# Patient Record
Sex: Female | Born: 1940 | Race: Black or African American | Hispanic: No | Smoking: Former smoker
Health system: Southern US, Community
[De-identification: ages and names within clinical notes are randomized; demographics above are authoritative.]

## PROBLEM LIST (undated history)

## (undated) DIAGNOSIS — I509 Heart failure, unspecified: Secondary | ICD-10-CM

## (undated) DIAGNOSIS — R0602 Shortness of breath: Secondary | ICD-10-CM

## (undated) DIAGNOSIS — F209 Schizophrenia, unspecified: Secondary | ICD-10-CM

## (undated) DIAGNOSIS — I10 Essential (primary) hypertension: Secondary | ICD-10-CM

## (undated) DIAGNOSIS — R569 Unspecified convulsions: Secondary | ICD-10-CM

## (undated) DIAGNOSIS — M79606 Pain in leg, unspecified: Secondary | ICD-10-CM

## (undated) DIAGNOSIS — R7611 Nonspecific reaction to tuberculin skin test without active tuberculosis: Secondary | ICD-10-CM

## (undated) DIAGNOSIS — I639 Cerebral infarction, unspecified: Secondary | ICD-10-CM

## (undated) DIAGNOSIS — I219 Acute myocardial infarction, unspecified: Secondary | ICD-10-CM

## (undated) HISTORY — DX: Essential (primary) hypertension: I10

## (undated) HISTORY — DX: Heart failure, unspecified: I50.9

## (undated) HISTORY — DX: Schizophrenia, unspecified: F20.9

## (undated) HISTORY — DX: Acute myocardial infarction, unspecified: I21.9

## (undated) HISTORY — DX: Shortness of breath: R06.02

## (undated) HISTORY — DX: Nonspecific reaction to tuberculin skin test without active tuberculosis: R76.11

## (undated) HISTORY — DX: Unspecified convulsions: R56.9

## (undated) HISTORY — PX: ABDOMINAL SURGERY: SHX537

## (undated) HISTORY — DX: Cerebral infarction, unspecified: I63.9

## (undated) HISTORY — DX: Pain in leg, unspecified: M79.606

---

## 2006-05-27 ENCOUNTER — Ambulatory Visit: Payer: Self-pay | Admitting: Cardiology

## 2006-07-17 ENCOUNTER — Ambulatory Visit: Payer: Self-pay | Admitting: Cardiology

## 2008-01-21 ENCOUNTER — Ambulatory Visit (HOSPITAL_COMMUNITY): Admission: RE | Admit: 2008-01-21 | Discharge: 2008-01-21 | Payer: Self-pay | Admitting: Orthopaedic Surgery

## 2008-05-16 ENCOUNTER — Emergency Department (HOSPITAL_COMMUNITY): Admission: EM | Admit: 2008-05-16 | Discharge: 2008-05-16 | Payer: Self-pay | Admitting: Emergency Medicine

## 2008-11-09 ENCOUNTER — Observation Stay (HOSPITAL_COMMUNITY): Admission: EM | Admit: 2008-11-09 | Discharge: 2008-11-11 | Payer: Self-pay | Admitting: Emergency Medicine

## 2008-11-09 ENCOUNTER — Ambulatory Visit: Payer: Self-pay | Admitting: Cardiology

## 2008-11-10 ENCOUNTER — Encounter: Payer: Self-pay | Admitting: Cardiology

## 2008-11-10 ENCOUNTER — Encounter (INDEPENDENT_AMBULATORY_CARE_PROVIDER_SITE_OTHER): Payer: Self-pay | Admitting: Pulmonary Disease

## 2008-11-11 ENCOUNTER — Encounter: Payer: Self-pay | Admitting: Cardiology

## 2009-01-07 ENCOUNTER — Encounter: Payer: Self-pay | Admitting: Cardiology

## 2009-01-07 ENCOUNTER — Emergency Department (HOSPITAL_COMMUNITY): Admission: EM | Admit: 2009-01-07 | Discharge: 2009-01-07 | Payer: Self-pay | Admitting: Emergency Medicine

## 2009-06-18 ENCOUNTER — Encounter: Payer: Self-pay | Admitting: Cardiology

## 2009-06-19 ENCOUNTER — Encounter: Payer: Self-pay | Admitting: Cardiology

## 2009-06-19 ENCOUNTER — Ambulatory Visit: Payer: Self-pay | Admitting: Cardiology

## 2009-06-24 ENCOUNTER — Encounter: Payer: Self-pay | Admitting: Cardiology

## 2009-07-18 ENCOUNTER — Encounter: Payer: Self-pay | Admitting: Physician Assistant

## 2009-07-24 ENCOUNTER — Encounter: Payer: Self-pay | Admitting: Cardiology

## 2009-08-09 ENCOUNTER — Encounter: Payer: Self-pay | Admitting: Cardiology

## 2009-09-01 ENCOUNTER — Encounter: Payer: Self-pay | Admitting: Cardiology

## 2009-09-07 DIAGNOSIS — R0609 Other forms of dyspnea: Secondary | ICD-10-CM | POA: Insufficient documentation

## 2009-09-07 DIAGNOSIS — Z8673 Personal history of transient ischemic attack (TIA), and cerebral infarction without residual deficits: Secondary | ICD-10-CM

## 2009-09-07 DIAGNOSIS — I5023 Acute on chronic systolic (congestive) heart failure: Secondary | ICD-10-CM

## 2009-09-07 DIAGNOSIS — I252 Old myocardial infarction: Secondary | ICD-10-CM

## 2009-09-07 DIAGNOSIS — I509 Heart failure, unspecified: Secondary | ICD-10-CM | POA: Insufficient documentation

## 2009-09-07 DIAGNOSIS — E875 Hyperkalemia: Secondary | ICD-10-CM | POA: Insufficient documentation

## 2009-09-07 DIAGNOSIS — I447 Left bundle-branch block, unspecified: Secondary | ICD-10-CM

## 2009-09-07 DIAGNOSIS — F17200 Nicotine dependence, unspecified, uncomplicated: Secondary | ICD-10-CM | POA: Insufficient documentation

## 2009-09-07 DIAGNOSIS — R0989 Other specified symptoms and signs involving the circulatory and respiratory systems: Secondary | ICD-10-CM

## 2009-09-07 DIAGNOSIS — I43 Cardiomyopathy in diseases classified elsewhere: Secondary | ICD-10-CM

## 2009-09-07 DIAGNOSIS — F205 Residual schizophrenia: Secondary | ICD-10-CM | POA: Insufficient documentation

## 2009-09-08 ENCOUNTER — Encounter (INDEPENDENT_AMBULATORY_CARE_PROVIDER_SITE_OTHER): Payer: Self-pay | Admitting: *Deleted

## 2009-09-08 ENCOUNTER — Ambulatory Visit: Payer: Self-pay | Admitting: Cardiology

## 2009-09-08 DIAGNOSIS — I10 Essential (primary) hypertension: Secondary | ICD-10-CM | POA: Insufficient documentation

## 2009-10-03 ENCOUNTER — Ambulatory Visit: Payer: Self-pay | Admitting: Cardiology

## 2009-10-03 ENCOUNTER — Encounter: Payer: Self-pay | Admitting: Cardiology

## 2009-10-27 ENCOUNTER — Encounter (INDEPENDENT_AMBULATORY_CARE_PROVIDER_SITE_OTHER): Payer: Self-pay | Admitting: *Deleted

## 2009-11-14 ENCOUNTER — Telehealth (INDEPENDENT_AMBULATORY_CARE_PROVIDER_SITE_OTHER): Payer: Self-pay | Admitting: *Deleted

## 2009-11-29 ENCOUNTER — Encounter: Payer: Self-pay | Admitting: Cardiology

## 2009-11-30 ENCOUNTER — Inpatient Hospital Stay (HOSPITAL_COMMUNITY): Admission: RE | Admit: 2009-11-30 | Discharge: 2009-12-06 | Payer: Self-pay | Admitting: Orthopedic Surgery

## 2009-11-30 ENCOUNTER — Ambulatory Visit: Payer: Self-pay | Admitting: Cardiology

## 2010-03-24 ENCOUNTER — Encounter: Payer: Self-pay | Admitting: Cardiology

## 2010-04-10 NOTE — Progress Notes (Signed)
Summary: [facility] calling regarding pt  Phone Note Other Incoming Call back at [phone] ext [phone]   Summary of Call: Alison Murray with DSS called regarding pt's surgical clearance. Notified Victorino Dike that we could not reach the pt. She states pt does not have a phone and her mentality is such that she would have probably thrown letter aware. Clearance will be faxed to DR. Rendall.  Initial call taken by: Cyril Loosen, RN, BSN,  November 14, 2009 4:59 PM

## 2010-04-10 NOTE — Letter (Signed)
Summary: Kari Baars - OFFICE NOTE  [facility] - OFFICE NOTE   Imported By: Claudette Laws 08/11/2009 15:36:27  _____________________________________________________________________  External Attachment:    Type:   Image     Comment:   External Document

## 2010-04-10 NOTE — Letter (Signed)
Summary: [facility] D/C DR. MARGARET CAMPBELL  [facility] D[facility]C DR. MARGARET CAMPBELL   Imported By: Zachary George 09/07/2009 18:26:52  _____________________________________________________________________  External Attachment:    Type:   Image     Comment:   External Document

## 2010-04-10 NOTE — Letter (Signed)
Summary: Discharge Summary  Discharge Summary   Imported By: Dorise Hiss 09/08/2009 08:12:28  _____________________________________________________________________  External Attachment:    Type:   Image     Comment:   External Document

## 2010-04-10 NOTE — Assessment & Plan Note (Signed)
Summary: EST-PRE-OP CLEARANCE FOR TOTAL KNEE   Visit Type:  Pre-op Evaluation Primary Provider:  Maryan Char  CC:  CHF.  History of Present Illness: The patient presents for evaluation of cardiomyopathy. She is in need of left knee replacement. She is referred for preoperative evaluation. She has been seen in the past and managed for her cardiomyopathy which is felt to be hypertensive. She's had an echocardiogram but no ischemia workup. She gets around very little because of her joint pains. She hasn't caseworker. She has schizophrenia. With a limited level of activity she denies any shortness of breath or chest discomfort. She does not have any PND or orthopnea. She has no palpitations, presyncope or syncope. She has no weight gain or edema.  Preventive Screening-Counseling & Management  Alcohol-Tobacco     Smoking Status: current     Smoking Cessation Counseling: yes     Packs/Day: 1 PPD  Current Medications (verified): 1)  Haloperidol 5 Mg Tabs (Haloperidol) .... Take 1 Tablet By Mouth Once A Day 2)  Carvedilol 12.5 Mg Tabs (Carvedilol) .... Take 1 Tablet By Mouth Two Times A Day 3)  Vitamin B-12 1000 Mcg Tabs (Cyanocobalamin) .... Take 1 Tablet By Mouth Once A Day 4)  Depakote 250 Mg Tbec (Divalproex Sodium) .... Take 1 Tablet By Mouth Three Times A Day 5)  Aspir-Trin 325 Mg Tbec (Aspirin) .... Take 1 Tablet By Mouth Once A Day 6)  Protonix 40 Mg Tbec (Pantoprazole Sodium) .... Take 1 Tablet By Mouth Once A Day 7)  Hydralazine Hcl 50 Mg Tabs (Hydralazine Hcl) .... Take 1 Tablet By Mouth Two Times A Day 8)  Vicodin 5-500 Mg Tabs (Hydrocodone-Acetaminophen) .... Take One By Mouth Every Six Hours 9)  Lisinopril 5 Mg Tabs (Lisinopril) .... Take 1 Tablet By Mouth Once A Day 10)  Amlodipine Besylate 10 Mg Tabs (Amlodipine Besylate) .... Take 1 Tablet By Mouth Once A Day 11)  Potassium Chloride Crys Cr 20 Meq Cr-Tabs (Potassium Chloride Crys Cr) .... Take 1 Tablet By Mouth Once A  Day 12)  Furosemide 40 Mg Tabs (Furosemide) .... Take 1 Tablet By Mouth Once A Day  Allergies (verified): No Known Drug Allergies  Comments:  Nurse/Medical Assistant: The patient's medication list and allergies were reviewed with the patient and were updated in the Medication and Allergy Lists.  Past History:  Past Medical History: Congestive heart failure.  Schizophrenia.  Hypertension.  Leg pain Shortness of breath CVA Hypertension MI  (vague history) Hx of seizures Positive TB Test  Past Surgical History: Abdominal surgery   Social History: Smoking Status:  current Packs/Day:  1 PPD  Review of Systems       As stated in the HPI and negative for all other systems.   Vital Signs:  Patient profile:   70 year old female Height:      63 inches Weight:      132 pounds BMI:     23.47 Pulse rate:   82 / minute BP sitting:   122 / 86  (left arm) Cuff size:   regular  Vitals Entered By: Carlye Grippe (September 08, 2009 11:03 AM) CC: CHF   Physical Exam  General:  Well developed, well nourished, in no acute distress. Head:  normocephalic and atraumatic Mouth:  Edentulous.  Oral mucosa normal. Neck:  Neck supple, no JVD. No masses, thyromegaly or abnormal cervical nodes. Chest Wall:  no deformities or breast masses noted Lungs:  Clear bilaterally to auscultation and percussion. Abdomen:  Bowel sounds positive; abdomen soft and non-tender without masses, organomegaly, or hernias noted. No hepatosplenomegaly. Msk:  diffuse muscle wasting Extremities:  diffuse severe arthritic changes with significant bony and soft tissue swelling of the left knee Neurologic:  oriented to person place and time, cranial nerves grossly intact, motor grossly intact Skin:  Intact without lesions or rashes. Cervical Nodes:  no significant adenopathy   Detailed Cardiovascular Exam  Neck    Carotids: Carotids full and equal bilaterally without bruits.      Neck Veins: Normal, no JVD.     Heart    Inspection: no deformities or lifts noted.      Palpation: PMI displaced laterally and somewhat sustained    Auscultation: regular rate and rhythm, S1, S2 without murmurs, rubs, gallops, or clicks.    Vascular    Abdominal Aorta: no palpable masses, pulsations, or audible bruits.      Femoral Pulses: normal femoral pulses bilaterally.      Pedal Pulses: normal pedal pulses bilaterally.      Radial Pulses: normal radial pulses bilaterally.      Peripheral Circulation: no clubbing, cyanosis, or edema noted with normal capillary refill.     EKG  Procedure date:  09/08/2009  Findings:      Sinus rhythm, left bundle branch block, left axis deviation  Impression & Recommendations:  Problem # 1:  CHF (ICD-428.0) She seems to be euvolemic and is on a good medical regimen. I will not change her meds. Orders: EKG w/ Interpretation (93000)  Problem # 2:  PREOPERATIVE EXAMINATION (ICD-V72.84) The patient would be at increased risk for cardiovascular complications particularly with volume overload. However, CHF alone would not the appropriate if contraindication. I do need to screen her for ischemic coronary disease with a perfusion study. If this is negative and surgery is pursued we would need to very closely follow her volume status and blood pressure throughout the course of surgery and recovery.  Problem # 3:  ESSENTIAL HYPERTENSION, BENIGN (ICD-401.1) Her blood pressure is controlled and she will continue the meds as listed.  Problem # 4:  NONDEPENDENT TOBACCO USE DISORDER (ICD-305.1) She understands the need to quit smoking.  Other Orders: Nuclear Med (Nuc Med)  Patient Instructions: 1)  Your physician has requested that you have an Lexiscan Cardiolite.  For further information please visit [email or web address]  Please follow instruction sheet, as given. 2)  Your physician wants you to follow-up in: 6 months. You will receive a reminder letter in the mail one-two  months in advance. If you don't receive a letter, please call our office to schedule the follow-up appointment.

## 2010-04-10 NOTE — Consult Note (Signed)
Summary: CARDIOLOGY CONSULT [facility]  [facility]OLOGY CONSULT [facility]   Imported By: Zachary George 09/07/2009 18:24:44  _____________________________________________________________________  External Attachment:    Type:   Image     Comment:   External Document

## 2010-04-10 NOTE — Letter (Signed)
Summary: Generic Engineer, agricultural at [facility] [address]   [place], [place] [address]   Phone: [phone]  Fax: [phone]        October 27, 2009 MRN: [id number]    [name] Johnson [address] [place], [place]  [address]    Dear Ms. PAULICK,   Please, contact our office as soon as possible regarding the results of your recent stress test. I attempted to reach you by phone at the contact number you gave [place], however, I was told that you do not live at that number.      Sincerely,    Cyril Loosen, RN, BSN  This letter has been electronically signed by your physician.

## 2010-04-10 NOTE — Letter (Signed)
Summary: Letter/ FAXED SURGERY CLEARANCE TO [facility]  Letter/ FAXED SURGERY CLEARANCE TO [facility]   Imported By: Dorise Hiss 11/29/2009 15:54:04  _____________________________________________________________________  External Attachment:    Type:   Image     Comment:   External Document

## 2010-04-10 NOTE — Letter (Signed)
Summary: Discharge Summary/ PROGRESS NOTE  Discharge Summary/ PROGRESS NOTE   Imported By: Dorise Hiss 09/08/2009 08:32:35  _____________________________________________________________________  External Attachment:    Type:   Image     Comment:   External Document

## 2010-04-10 NOTE — Letter (Signed)
Summary: Lexiscan or Dobutamine Pharmacist, community at [facility]  [address]   [place], [place] [address]   Phone: [phone]  Fax: [phone]      [facility] Cardiovascular Services  Lexiscan or Dobutamine Cardiolite Strss Test    [name] Johnson  Appointment Date:_  Appointment Time:_  Your doctor has ordered a CARDIOLITE STRESS TEST using a medication to stimulate exercise so that you will not have to walk on the treadmill to determine the condition of your heart during stress. If you take blood pressure medication, ask your doctor if you should take it the day of your test. You should not have anything to eat or drink at least 4 hours before your test is scheduled, and no caffeine, including decaffeinated tea and coffee, chocolate, and soft drinks for 24 hours before your test.  You will need to register at the Outpatient/Main Entrance at the hospital 15 minutes before your appointment time. It is a good idea to bring a copy of your order with you. They will direct you to the Diagnostic Imaging (Radiology) Department.  You will be asked to undress from the waist up and given a hospital gown to wear, so dress comfortably from the waist down for example: Sweat pants, shorts, or skirt Rubber soled lace up shoes (tennis shoes)  Plan on about three hours from registration to release from the hospital  You can take all of your medications with water the morning of your test. However, on the day of your test, you may want to hold your hydralazine, lasix and potassium until after the test is complete.

## 2010-04-13 NOTE — Letter (Signed)
Summary: Internal Other/ PATIENT HISTORY FORM  Internal Other/ PATIENT HISTORY FORM   Imported By: Dorise Hiss 09/01/2009 08:49:31  _____________________________________________________________________  External Attachment:    Type:   Image     Comment:   External Document

## 2010-04-19 ENCOUNTER — Ambulatory Visit: Payer: Medicare Other | Admitting: Cardiology

## 2010-05-24 LAB — CBC
HCT: 22.9 % — ABNORMAL LOW (ref 36.0–46.0)
HCT: 24.9 % — ABNORMAL LOW (ref 36.0–46.0)
HCT: 26 % — ABNORMAL LOW (ref 36.0–46.0)
HCT: 27.3 % — ABNORMAL LOW (ref 36.0–46.0)
HCT: 38.6 % (ref 36.0–46.0)
Hemoglobin: 13 g/dL (ref 12.0–15.0)
Hemoglobin: 7.7 g/dL — ABNORMAL LOW (ref 12.0–15.0)
Hemoglobin: 8.3 g/dL — ABNORMAL LOW (ref 12.0–15.0)
MCH: 31.1 pg (ref 26.0–34.0)
MCH: 31.2 pg (ref 26.0–34.0)
MCH: 31.3 pg (ref 26.0–34.0)
MCH: 31.3 pg (ref 26.0–34.0)
MCHC: 33.4 g/dL (ref 30.0–36.0)
MCHC: 33.6 g/dL (ref 30.0–36.0)
MCHC: 34.2 g/dL (ref 30.0–36.0)
MCV: 92.1 fL (ref 78.0–100.0)
MCV: 92.4 fL (ref 78.0–100.0)
MCV: 92.8 fL (ref 78.0–100.0)
MCV: 93.5 fL (ref 78.0–100.0)
Platelets: 211 10*3/uL (ref 150–400)
Platelets: 219 10*3/uL (ref 150–400)
Platelets: 312 10*3/uL (ref 150–400)
Platelets: 403 10*3/uL — ABNORMAL HIGH (ref 150–400)
RBC: 2.47 MIL/uL — ABNORMAL LOW (ref 3.87–5.11)
RDW: 16 % — ABNORMAL HIGH (ref 11.5–15.5)
RDW: 16.6 % — ABNORMAL HIGH (ref 11.5–15.5)
RDW: 17 % — ABNORMAL HIGH (ref 11.5–15.5)
WBC: 5.3 10*3/uL (ref 4.0–10.5)
WBC: 5.6 10*3/uL (ref 4.0–10.5)
WBC: 6.2 10*3/uL (ref 4.0–10.5)

## 2010-05-24 LAB — BASIC METABOLIC PANEL
BUN: 11 mg/dL (ref 6–23)
BUN: 8 mg/dL (ref 6–23)
CO2: 25 mEq/L (ref 19–32)
Calcium: 8.2 mg/dL — ABNORMAL LOW (ref 8.4–10.5)
Chloride: 100 mEq/L (ref 96–112)
Chloride: 101 mEq/L (ref 96–112)
Creatinine, Ser: 0.89 mg/dL (ref 0.4–1.2)
GFR calc Af Amer: >60 mL/min (ref >60)
GFR calc Af Amer: >60 mL/min (ref >60)
GFR calc non Af Amer: 58 mL/min — ABNORMAL LOW (ref >60)
Glucose, Bld: 101 mg/dL — ABNORMAL HIGH (ref 70–99)
Glucose, Bld: 124 mg/dL — ABNORMAL HIGH (ref 70–99)
Potassium: 3.5 mEq/L (ref 3.5–5.1)
Sodium: 131 mEq/L — ABNORMAL LOW (ref 135–145)

## 2010-05-24 LAB — CROSSMATCH
ABO/RH(D): B POS
ABO/RH(D): B POS
Antibody Screen: NEGATIVE

## 2010-05-24 LAB — DIFFERENTIAL
Basophils Absolute: 0.1 10*3/uL (ref 0.0–0.1)
Eosinophils Relative: 4 % (ref 0–5)
Lymphocytes Relative: 40 % (ref 12–46)
Lymphs Abs: 2.1 10*3/uL (ref 0.7–4.0)
Lymphs Abs: 5.4 10*3/uL — ABNORMAL HIGH (ref 0.7–4.0)
Monocytes Absolute: 0.2 10*3/uL (ref 0.1–1.0)
Neutro Abs: 2.6 10*3/uL (ref 1.7–7.7)
Neutro Abs: 4.8 10*3/uL (ref 1.7–7.7)

## 2010-05-24 LAB — COMPREHENSIVE METABOLIC PANEL
ALT: 17 U/L (ref 0–35)
Albumin: 4 g/dL (ref 3.5–5.2)
Alkaline Phosphatase: 73 U/L (ref 39–117)
CO2: 30 mEq/L (ref 19–32)
Calcium: 9.6 mg/dL (ref 8.4–10.5)
GFR calc Af Amer: >60 mL/min (ref >60)
GFR calc non Af Amer: 57 mL/min — ABNORMAL LOW (ref >60)
Glucose, Bld: 84 mg/dL (ref 70–99)
Potassium: 4 mEq/L (ref 3.5–5.1)
Total Bilirubin: 1 mg/dL (ref 0.3–1.2)

## 2010-05-24 LAB — SURGICAL PCR SCREEN
MRSA, PCR: NEGATIVE
Staphylococcus aureus: NEGATIVE

## 2010-05-24 LAB — URINALYSIS, ROUTINE W REFLEX MICROSCOPIC
Ketones, ur: NEGATIVE mg/dL
Nitrite: NEGATIVE
Protein, ur: NEGATIVE mg/dL

## 2010-05-24 LAB — ABO/RH: ABO/RH(D): B POS

## 2010-05-24 LAB — PROTIME-INR: Prothrombin Time: 14.1 seconds (ref 11.6–15.2)

## 2010-06-14 LAB — CBC
HCT: 33.8 % — ABNORMAL LOW (ref 36.0–46.0)
Hemoglobin: 11.1 g/dL — ABNORMAL LOW (ref 12.0–15.0)
RBC: 3.71 MIL/uL — ABNORMAL LOW (ref 3.87–5.11)
WBC: 5.4 10*3/uL (ref 4.0–10.5)

## 2010-06-14 LAB — DIFFERENTIAL
Basophils Relative: 0 % (ref 0–1)
Eosinophils Absolute: 0.1 10*3/uL (ref 0.0–0.7)
Lymphocytes Relative: 31 % (ref 12–46)
Monocytes Relative: 8 % (ref 3–12)
Neutro Abs: 3.2 10*3/uL (ref 1.7–7.7)
Neutrophils Relative %: 59 % (ref 43–77)

## 2010-06-14 LAB — BASIC METABOLIC PANEL
Calcium: 8.8 mg/dL (ref 8.4–10.5)
GFR calc Af Amer: >60 mL/min (ref >60)
GFR calc non Af Amer: 55 mL/min — ABNORMAL LOW (ref >60)
Potassium: 3.6 mEq/L (ref 3.5–5.1)
Sodium: 137 mEq/L (ref 135–145)

## 2010-06-14 LAB — POCT CARDIAC MARKERS
Myoglobin, poc: 109 ng/mL (ref 12–200)
Troponin i, poc: <0.05 ng/mL (ref 0.00–0.09)

## 2010-06-14 LAB — BRAIN NATRIURETIC PEPTIDE: Pro B Natriuretic peptide (BNP): 442 pg/mL — ABNORMAL HIGH (ref 0.0–100.0)

## 2010-06-15 LAB — DIFFERENTIAL
Basophils Absolute: 0.1 10*3/uL (ref 0.0–0.1)
Basophils Relative: 1 % (ref 0–1)
Lymphocytes Relative: 25 % (ref 12–46)
Monocytes Absolute: 0.5 10*3/uL (ref 0.1–1.0)
Neutro Abs: 4.7 10*3/uL (ref 1.7–7.7)
Neutrophils Relative %: 65 % (ref 43–77)

## 2010-06-15 LAB — URINE CULTURE: Colony Count: NO GROWTH

## 2010-06-15 LAB — CBC
HCT: 33 % — ABNORMAL LOW (ref 36.0–46.0)
Hemoglobin: 11.2 g/dL — ABNORMAL LOW (ref 12.0–15.0)
MCV: 88.8 fL (ref 78.0–100.0)
WBC: 7.3 10*3/uL (ref 4.0–10.5)

## 2010-06-15 LAB — COMPREHENSIVE METABOLIC PANEL
Alkaline Phosphatase: 56 U/L (ref 39–117)
BUN: 13 mg/dL (ref 6–23)
CO2: 28 mEq/L (ref 19–32)
Chloride: 100 mEq/L (ref 96–112)
Creatinine, Ser: 1.12 mg/dL (ref 0.4–1.2)
GFR calc non Af Amer: 49 mL/min — ABNORMAL LOW (ref >60)
Glucose, Bld: 113 mg/dL — ABNORMAL HIGH (ref 70–99)
Potassium: 3.6 mEq/L (ref 3.5–5.1)
Total Bilirubin: 0.2 mg/dL — ABNORMAL LOW (ref 0.3–1.2)

## 2010-06-15 LAB — URINALYSIS, ROUTINE W REFLEX MICROSCOPIC
Glucose, UA: NEGATIVE mg/dL
Hgb urine dipstick: NEGATIVE
Ketones, ur: NEGATIVE mg/dL
Protein, ur: NEGATIVE mg/dL
pH: 5.5 (ref 5.0–8.0)

## 2010-06-15 LAB — RAPID URINE DRUG SCREEN, HOSP PERFORMED
Amphetamines: NOT DETECTED
Barbiturates: NOT DETECTED
Cocaine: NOT DETECTED
Opiates: NOT DETECTED

## 2010-06-15 LAB — POCT CARDIAC MARKERS
CKMB, poc: 1.4 ng/mL (ref 1.0–8.0)
Myoglobin, poc: 149 ng/mL (ref 12–200)

## 2010-06-15 LAB — GLUCOSE, CAPILLARY: Glucose-Capillary: 126 mg/dL — ABNORMAL HIGH (ref 70–99)

## 2010-07-24 NOTE — Group Therapy Note (Signed)
NAMEEDIT, RICCIARDELLI            ACCOUNT NO.:  [id number]   MEDICAL RECORD NO.:  [id number]          PATIENT TYPE:  INP   LOCATION:  A212                          FACILITY:  [facility]   PHYSICIAN:  Edward L. Juanetta Gosling, M.D.DATE OF BIRTH:  02/14/1941   DATE OF PROCEDURE:  11/10/2008  DATE OF DISCHARGE:                                 PROGRESS NOTE   Ms. Glasper was admitted yesterday with change in mental status, but  this morning, she is back to baseline.  She is awake and alert.  She  still not able to give much of a history.  She says that she has some  back pain and that she was told at [facility] that she had a  broken back.  I have not seen her in my office in sometime and I am  really not sure what medications she is on whether she is in getting any  psychiatric care at all now.   Her physical exam shows that she is awake and alert.  Temperature is  97.1, pulse 66, respirations 18, blood pressure 152/91, O2 sats 98% on 2  L.  She was eaten her breakfast.  She looks comfortable.  Her chest is  clear and her heart is regular.   ASSESSMENT:  She has chronic mental illness, which I think is pretty  much unchanged.  I am not certain if she is on treatment for that or  not.  She had change in mental status, which is much improved.  She  complains of low back pain and I am not certain if she indeed does have  any sort of a problem with her back or not, and I am going to see if I  get the information from [facility].  Her blood pressures come  up.  I had held her Coreg and I will restart that today.  She has marked  cardiomegaly on chest x-ray and she is going to have an echocardiogram.      Ramon Dredge L. Juanetta Gosling, M.D.  Electronically Signed     ELH/MEDQ  D:  11/10/2008  T:  11/10/2008  Job:  [id number]

## 2010-07-24 NOTE — H&P (Signed)
NAMEGAILE, [name]            ACCOUNT NO.:  [id number]   MEDICAL RECORD NO.:  [id number]          PATIENT TYPE:  INP   LOCATION:  A212                          FACILITY:  [facility]   PHYSICIAN:  Edward L. Juanetta Gosling, M.D.DATE OF BIRTH:  1940-05-19   DATE OF ADMISSION:  11/09/2008  DATE OF DISCHARGE:  LH                              HISTORY & PHYSICAL   REASON FOR ADMISSION:  Change in mental status.   HISTORY:  [name] Johnson is a 70 year old African American female who came  with complaints of altered mental status.  She was at her home,  apparently had a call from family or friends stating that she was weak  and that she was not thinking properly.  She was brought to the  emergency room but none of her family came with her, so history is  fairly sketchy.  It is unknown exactly how long she has had this.  Her  past medical history is positive for hypertension also positive for  mental illness and in the past she has been under the care of [facility] and has had a case worker who typically accompany  her to office visits, etc.  I have not seen her in my office in about 2  years.   Her past medical history is positive for hypertension and for mental  illness, the exact type of that is unknown.   Her family history is unknown.   SOCIAL HISTORY:  She does not use any alcohol.  She smokes about a pack  of a cigarettes daily apparently.  No known illicit drug abuse.   Her review of systems is unobtainable.   She brought medications with her, which include Lasix 40 mg daily,  lisinopril 5 mg daily, carvedilol 3.125 mg b.i.d.   It is not known if she has any allergies to medications.   Physical exam showed that she is arousable, but very sleepy and  sluggish.  Her pupils do react.  Her nose and throat are clear.  Her  mucous membranes are slightly dry.  Her neck is supple without masses.  Her chest is clear without wheezes.  Her heart is regular without  murmur,  gallop, or rub.  Her abdomen is soft.  No masses felt.  Extremities showed no edema.  CNS nothing focal, but she is very  sluggish.   LABORATORY DATA:  White count 7300, hemoglobin is 11.2, platelets 298,  metabolic profile shows a glucose of 113, albumin is 3.1.  Cardiac  markers are normal, now x2.  Drug screen shows positive for  benzodiazepines, and she had not brought any benzodiazepines with her.  In the past, she has followed with [facility] for  her mental illness and she may be getting benzodiazepines from there,  but it is all this part of her history as it is very unclear.  Her EKG  shows no acute changes and her chest x-ray shows cardiomegaly, mild  pulmonary vascular congestion.  No focal infiltrates.   ASSESSMENT:  She has change in mental status but not having seen her in  sometime.  I am not sure what her normal mental status is.  She has  hypertension.  She has mental illness and she has marked cardiomegaly,  so she is  going to need to have a echocardiogram.  At this point, she is going to  be brought in for observation.  She is going to have serial neuro  checks, and she will be monitored and then try to get more information  from family or others tomorrow.      Edward L. Juanetta Gosling, M.D.  Electronically Signed     ELH/MEDQ  D:  11/09/2008  T:  11/10/2008  Job:  [id number]

## 2010-07-28 ENCOUNTER — Inpatient Hospital Stay (HOSPITAL_COMMUNITY): Payer: Medicare Other

## 2010-07-28 ENCOUNTER — Inpatient Hospital Stay (HOSPITAL_COMMUNITY)
Admission: EM | Admit: 2010-07-28 | Discharge: 2010-08-03 | DRG: 292 | Disposition: A | Discharge status: Home Health | Payer: Medicare Other | Source: Other Acute Inpatient Hospital | Attending: Internal Medicine | Admitting: Internal Medicine

## 2010-07-28 DIAGNOSIS — G40909 Epilepsy, unspecified, not intractable, without status epilepticus: Secondary | ICD-10-CM | POA: Diagnosis present

## 2010-07-28 DIAGNOSIS — Z91199 Patient's noncompliance with other medical treatment and regimen due to unspecified reason: Secondary | ICD-10-CM

## 2010-07-28 DIAGNOSIS — J4489 Other specified chronic obstructive pulmonary disease: Secondary | ICD-10-CM | POA: Diagnosis present

## 2010-07-28 DIAGNOSIS — M549 Dorsalgia, unspecified: Secondary | ICD-10-CM | POA: Diagnosis present

## 2010-07-28 DIAGNOSIS — J449 Chronic obstructive pulmonary disease, unspecified: Secondary | ICD-10-CM | POA: Diagnosis present

## 2010-07-28 DIAGNOSIS — E039 Hypothyroidism, unspecified: Secondary | ICD-10-CM | POA: Diagnosis present

## 2010-07-28 DIAGNOSIS — E785 Hyperlipidemia, unspecified: Secondary | ICD-10-CM | POA: Diagnosis present

## 2010-07-28 DIAGNOSIS — Z8673 Personal history of transient ischemic attack (TIA), and cerebral infarction without residual deficits: Secondary | ICD-10-CM

## 2010-07-28 DIAGNOSIS — I2510 Atherosclerotic heart disease of native coronary artery without angina pectoris: Secondary | ICD-10-CM | POA: Diagnosis present

## 2010-07-28 DIAGNOSIS — I2589 Other forms of chronic ischemic heart disease: Secondary | ICD-10-CM | POA: Diagnosis present

## 2010-07-28 DIAGNOSIS — Z9119 Patient's noncompliance with other medical treatment and regimen: Secondary | ICD-10-CM

## 2010-07-28 DIAGNOSIS — F200 Paranoid schizophrenia: Secondary | ICD-10-CM | POA: Diagnosis present

## 2010-07-28 DIAGNOSIS — I10 Essential (primary) hypertension: Secondary | ICD-10-CM | POA: Diagnosis present

## 2010-07-28 DIAGNOSIS — Z96659 Presence of unspecified artificial knee joint: Secondary | ICD-10-CM

## 2010-07-28 DIAGNOSIS — I5023 Acute on chronic systolic (congestive) heart failure: Principal | ICD-10-CM | POA: Diagnosis present

## 2010-07-28 DIAGNOSIS — I509 Heart failure, unspecified: Secondary | ICD-10-CM | POA: Diagnosis present

## 2010-07-28 DIAGNOSIS — Z7982 Long term (current) use of aspirin: Secondary | ICD-10-CM

## 2010-07-28 LAB — LIPID PANEL
Cholesterol: 149 mg/dL (ref 0–200)
Total CHOL/HDL Ratio: 2.4 RATIO
VLDL: 10 mg/dL (ref 0–40)

## 2010-07-28 LAB — DIFFERENTIAL
Basophils Absolute: 0 10*3/uL (ref 0.0–0.1)
Lymphocytes Relative: 20 % (ref 12–46)
Lymphs Abs: 1.2 10*3/uL (ref 0.7–4.0)
Monocytes Absolute: 0.1 10*3/uL (ref 0.1–1.0)
Neutro Abs: 4.8 10*3/uL (ref 1.7–7.7)

## 2010-07-28 LAB — COMPREHENSIVE METABOLIC PANEL
ALT: 14 U/L (ref 0–35)
AST: 19 U/L (ref 0–37)
Alkaline Phosphatase: 81 U/L (ref 39–117)
CO2: 27 mEq/L (ref 19–32)
Calcium: 8.8 mg/dL (ref 8.4–10.5)
Chloride: 102 mEq/L (ref 96–112)
GFR calc non Af Amer: >60 mL/min (ref >60)
Glucose, Bld: 125 mg/dL — ABNORMAL HIGH (ref 70–99)
Sodium: 139 mEq/L (ref 135–145)
Total Bilirubin: 0.1 mg/dL — ABNORMAL LOW (ref 0.3–1.2)

## 2010-07-28 LAB — CBC
Hemoglobin: 13.7 g/dL (ref 12.0–15.0)
MCH: 28.7 pg (ref 26.0–34.0)
MCHC: 33.7 g/dL (ref 30.0–36.0)
MCV: 84.9 fL (ref 78.0–100.0)
RBC: 4.78 MIL/uL (ref 3.87–5.11)
RDW: 15.4 % (ref 11.5–15.5)
WBC: 6.2 10*3/uL (ref 4.0–10.5)

## 2010-07-28 LAB — PRO B NATRIURETIC PEPTIDE: Pro B Natriuretic peptide (BNP): 12162 pg/mL — ABNORMAL HIGH (ref 0–125)

## 2010-07-28 LAB — CARDIAC PANEL(CRET KIN+CKTOT+MB+TROPI)
Total CK: 251 U/L — ABNORMAL HIGH (ref 7–177)
Total CK: 289 U/L — ABNORMAL HIGH (ref 7–177)
Troponin I: <0.3 ng/mL (ref <0.30)

## 2010-07-28 LAB — MAGNESIUM: Magnesium: 1.9 mg/dL (ref 1.5–2.5)

## 2010-07-28 LAB — TSH: TSH: 0.579 u[IU]/mL (ref 0.350–4.500)

## 2010-07-30 DIAGNOSIS — I501 Left ventricular failure: Secondary | ICD-10-CM

## 2010-07-30 LAB — BASIC METABOLIC PANEL
CO2: 28 mEq/L (ref 19–32)
Calcium: 8.9 mg/dL (ref 8.4–10.5)
Chloride: 96 mEq/L (ref 96–112)
Creatinine, Ser: 0.75 mg/dL (ref 0.4–1.2)
Glucose, Bld: 84 mg/dL (ref 70–99)

## 2010-07-30 LAB — PHOSPHORUS: Phosphorus: 4.1 mg/dL (ref 2.3–4.6)

## 2010-07-31 DIAGNOSIS — F200 Paranoid schizophrenia: Secondary | ICD-10-CM

## 2010-07-31 LAB — MAGNESIUM: Magnesium: 2.1 mg/dL (ref 1.5–2.5)

## 2010-07-31 LAB — BASIC METABOLIC PANEL
BUN: 17 mg/dL (ref 6–23)
CO2: 27 mEq/L (ref 19–32)
Chloride: 93 mEq/L — ABNORMAL LOW (ref 96–112)
Creatinine, Ser: 0.87 mg/dL (ref 0.4–1.2)
Potassium: 4.1 mEq/L (ref 3.5–5.1)

## 2010-07-31 LAB — PHOSPHORUS: Phosphorus: 3.8 mg/dL (ref 2.3–4.6)

## 2010-07-31 NOTE — Consult Note (Signed)
  NAME:  [name] Johnson, [name] Johnson            ACCOUNT NO.:  [id number]  MEDICAL RECORD NO.:  [id number]           PATIENT TYPE:  I  LOCATION:  4741                         FACILITY:  [facility]  PHYSICIAN:  Eulogio Ditch, MD DATE OF BIRTH:  06/21/1940  DATE OF CONSULTATION: DATE OF DISCHARGE:                                CONSULTATION   REASON FOR CONSULT:  Agitation and medication management.  HISTORY OF PRESENT ILLNESS:  A 70 year old female who was transferred from [facility] secondary to diagnosis of CHF exacerbation.  The patient has a history of schizophrenia.  She is on fluphenazine 25 mg IM along with Depakote 250 mg three times a day and Haldol 5 mg at bedtime.  When I saw the patient, the patient was labile, irritable, unable to provide any logical information.  As per the nursing staff, the patient was agitated on and off and was also paranoid.  The patient denies hearing any voices.  Denies any suicidal or homicidal ideations.The patient reported that she has a case manager named Victorino Dike and we can call her to get more information.  SUBSTANCE ABUSE HISTORY:  None reported.  MENTAL STATUS EXAMINATION:  The patient is irritable, labile during the interview.  No abnormal movements noticed.  No symptoms or signs of tardive dyskinesia present.  Mood anxious, irritable during the interview.  Affect mood congruent.  Thought process.  The patient is not logical and at certain times was difficult to redirect.  Denies hearing any voices.  Does not seem to be internally preoccupied, alert, awake, oriented to place and person, but not to time.  Memory immediate, recent remote poor.  Abstraction ability poor.  Insight and judgment poor.  DIAGNOSES:  AXIS I:  As per history schizophrenia, paranoid type. AXIS II:  Deferred. AXIS III:  See medical notes. AXIS IV:  Chronic mental and medical issues. AXIS V:  40 to 50.  RECOMMENDATIONS: 1. I added Klonopin 0.5 mg three  times a day for agitation. 2. I also spoke with Dr. Delsa Grana and told him to speak with the case     manager, Victorino Dike to get more information on this patient regarding     the psych history and psych medications. 3. Sitter can be continued at this time for agitation and     inappropriate behavior. 4. I will follow up on this patient as needed.  Thanks for involving me in taking care of this patient.     Eulogio Ditch, MD     SA/MEDQ  D:  07/31/2010  T:  07/31/2010  Job:  [id number]  Electronically Signed by Eulogio Ditch  on 07/31/2010 07:34:09 PM

## 2010-08-02 DIAGNOSIS — F200 Paranoid schizophrenia: Secondary | ICD-10-CM

## 2010-08-09 NOTE — Discharge Summary (Signed)
  [name], Johnson            ACCOUNT NO.:  [id number]  MEDICAL RECORD NO.:  [id number]           PATIENT TYPE:  I  LOCATION:  4741                         FACILITY:  [facility]  PHYSICIAN:  Peggye Pitt, M.D. DATE OF BIRTH:  [birth year]  DATE OF ADMISSION:  07/28/2010 DATE OF DISCHARGE:  08/03/2010                              DISCHARGE SUMMARY   ADDENDUM  The patient's discharge was delayed on the basis of her schizophrenia. We have been waiting Psychiatry to assess her for decision making capacity.  Dr. Rogers Blocker with Psychiatry has seen her and believes that she lacks decision making capacity given her schizophrenia.  PT had initially recommended SNF, so she had stayed trying to find a SNF bed. However, after multiple discussions between Social Work and the patient's out-of-facility Child psychotherapist and Sports coach, we believe that the patient has adequate support and followup at home in order to go home.  It appears that she has been thriving in her home setting, and on the contrary, has become very agitated and combative when she has been admitted to a facility as has happened in the past.  At this point, we will make a decision to send her home.  She will need to follow up with her PCP, Dr. Kari Baars within 2-4 weeks.  Please note that prior discharge diagnoses and medications have not changed since dictated by Dr. Delsa Grana on Jul 31, 2010.     Peggye Pitt, M.D.     EH/MEDQ  D:  08/03/2010  T:  08/04/2010  Job:  [id number]  Electronically Signed by Peggye Pitt M.D. on 08/09/2010 07:43:08 AM

## 2010-08-09 NOTE — H&P (Signed)
NAMETERRILEE, [name] Johnson            ACCOUNT NO.:  [id number]  MEDICAL RECORD NO.:  [id number]           PATIENT TYPE:  I  LOCATION:  4741                         FACILITY:  [facility]  PHYSICIAN:  Lonia Blood, M.D.      DATE OF BIRTH:  [birth year]  DATE OF ADMISSION:  07/28/2010 DATE OF DISCHARGE:                             HISTORY & PHYSICAL   PRIMARY CARE PHYSICIAN:  [name] L. Juanetta Gosling, MD in [place].  PRESENTING COMPLAINT:  Shortness of breath.  HISTORY OF PRESENT ILLNESS:  The patient is a 70 year old female who transferred from [facility] secondary to diagnosis of CHF exacerbation.  She was seen in the ED there with complaint of sudden onset of shortness of breath at home.  She had known history of COPD and CHF.  EF is unknown to [place] at this point.  She apparently was seen by EMS.  She was brought in there, no chest pain but mainly shortness of breath with some cough.  Her evaluation including chest x-ray showed pulmonary edema.  She was to be admitted for inpatient care there, however, they have no tele bed, so the patient was transferred to [place] for further management.  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  ALLERGIES:  NONSTEROIDAL ANTI-INFLAMMATORY AGENTS.  SOCIAL HISTORY:  The patient lives in the ED, [place].  She smokes about one pack per day.  Denied alcohol or IV drug use.  She is single and resides currently at home.  FAMILY HISTORY:  Denied any significant family history.  CURRENT MEDICATIONS:  Home medicines include: 1. Lasix 40 mg daily. 2. Fluphenazine 25 mg IM as of 2 weeks. 3. Lisinopril 20 mg daily. 4. Coreg 3.125 mg b.i.d. 5. Potassium  chloride 20 mEq daily. 6. Haldol 5 mg every night. 7. Depakote 250 mg p.o. t.i.d.  PHYSICAL EXAMINATION:  VITAL SIGNS:  She is afebrile with temperature of 98, blood pressure 120/64, pulse 82, respiratory rate 16, sats 93% on room air.  GENERAL:  She is awake, alert, oriented, chronically ill- looking patient, small frame.  She is in no acute distress. HEENT:  PERRL.  EOMI.  No pallor, no jaundice.  No rhinorrhea. NECK:  Supple.  No visible JVD.  No lymphadenopathy. RESPIRATORY:  She has poor decreased air entry at the bases with some mild crackles.  No wheezes heard. EXTREMITIES:  No edema, cyanosis or clubbing. SKIN:  No rashes or ulcers.  LABORATORY DATA:  Her labs here are currently pending, but she did have labs from [facility] that are consistent with white count 7.1, hemoglobin of 13.0 with platelet count 255, relatively normal differentials.  Her pH is 7.36, pCO2 37, pO2 of 56 with oxygen sat of 92.6.  Her chest x-ray also showed pulmonary edema.  ASSESSMENT:  This is a 70 year old female presenting with sudden onset of shortness of breath and seems to have congestive heart failure exacerbation.  PLAN: 1. Acute CHF  exacerbation.  We will admit the patient to tele bed.  IV     Lasix.  Check her a echo.  Obtain her previous records.  Workup for     her CHF.  We will see if she has any other cardiologist that we can     refer her back to.  Her EKG shows normal sinus rhythm with evidence     of LVH, IVCD, LAD and prolonged QT.  We do not have an old one to     compare at this point. 2. Schizophrenia.  We will continue with her home medications. 3. Hypertension.  Again, I will continue with her home medicines as     much as possible. 4. Hyperlipidemia.  We will check fasting lipid panel and continue her     current medications. 5. Chronic back pain.  Continue with pain control as per home     medicine.  Other problem seems stable and we will treat them as     they come  along.     Lonia Blood, M.D.     Verlin Grills  D:  07/28/2010  T:  07/28/2010  Job:  [id number]  Electronically Signed by Lonia Blood M.D. on 08/09/2010 11:52:53 AM

## 2010-08-21 NOTE — Discharge Summary (Signed)
[name] Johnson, FOGARTY            ACCOUNT NO.:  [id number]  MEDICAL RECORD NO.:  [id number]           PATIENT TYPE:  I  LOCATION:  4741                         FACILITY:  [facility]  PHYSICIAN:  Rock Nephew, MD       DATE OF BIRTH:  May 18, 1940  DATE OF ADMISSION:  07/28/2010 DATE OF DISCHARGE:                        DISCHARGE SUMMARY - REFERRING   PRIMARY CARE PHYSICIAN:  Edward L. Juanetta Gosling, M.D.  PRIMARY CARDIOLOGIST:  Rollene Rotunda, MD, [facility]  DISCHARGE DIAGNOSES: 1. Acute systolic congestive heart failure and chronic obstructive     pulmonary disease not on home oxygen. 2. Schizophrenia with agitation, awaiting capacity evaluation and     medical management. 3. Hypothyroidism.  TSH within normal limits. 4. Chronic back pain. 5. Noncompliance. 6. Unsteadiness ataxia needing SNF. 7. History of hyperlipidemia. 8. History of previous cerebrovascular accident. 9. History of hypertension. 10.Coronary artery disease.  DISCHARGE MEDICATIONS:  Will be dictated at the time of discharge, however the patient should be on following medications, most likely 1. Acetaminophen. 2. Lisinopril 20 mg p.o. daily. 3. Depakote 250 mg p.o. t.i.d. 4. Fluphenazine injection IM 25 mg every 2 weeks. 5. Haldol 1 mg p.o. daily. 6. Carvedilol 3.125 mg p.o. b.i.d. 7. Furosemide 40 mg p.o. daily. 8. Potassium chloride 20 mEq p.o. daily. 9. Aspirin 81 mg p.o. daily. 10.Lorazepam 1 mg p.o. q.6 h p.r.n. anxiety, agitation. 11.Atrovent 0.5 mg neb inhaled b.i.d.  DISPOSITION:  Still pending.  Physical therapy, occupational therapy have recommended skilled nursing facility and also we are waiting psych consult for a psychiatric evaluation of the patient and if the patient needs inpatient psychiatric.  The patient is currently with a sitter.  DIET:  Heart-healthy with a 1.5 liters fluid restriction.  CONSULTATIONS:  Psychiatry.  PROCEDURES PERFORMED:  The patient had two-view chest x-ray on Jul 28, 2010 showed cardiomegaly without edema low lung volumes.  The patient had a 2-D echocardiogram which showed left ventricle ejection fraction of 30%-35% which is improved from previously.  Again disposition was SNF versus inpatient psych.  BRIEF HISTORY OF PRESENT ILLNESS:  Chief complaint was shortness breath.  This is a 70 year old female who was transferred from [facility] secondary diagnosis of CHF exacerbation.  The reason for the transfer was [facility] did not have any telemetry beds.  HOSPITAL COURSE: 1. Acute systolic congestive heart failure.  The patient came to the     hospital.  The patient did not look visibly short of breath.     However the patient had a pro BNP of 12,162.  She was diuresed by     IV Lasix.  The patient was continued on other cardiac medications.     The patient was later switched over p.o. Lasix her home dose.  I     suspect the reason for this CHF exacerbation was noncompliance.  EF     showed a ejection fraction to 30%-35% which is improvement. 2. Chronic obstructive pulmonary disease.  The patient's COPD is     stable.  The patient is getting schedule Atrovent.  She is not on     home oxygen. 3. Schizophrenia.  The patient was agitated at times, she is still     requiring a Comptroller.  We consulted psychiatric on Sunday for a     capacity evaluation as well as medical management and also the     question whether or not the patient needs inpatient psych     stabilization.  Currently, we are controlling the patient with     Ativan. 4. Hypothyroidism.  The patient had TSH level drawn which was within     normal limits.  The patient does not seem to take levothyroxine at     home. 5. Chronic back pain.  The patient has a history of chronic back pain,     which has been stable. 6. Noncompliance.  The patient was counseled. 7. Unsteadiness and ataxia.  The patient was seen by physical therapy,     occupational therapy they  recommended skilled nursing home     placement. 8. DVT prophylaxis.  The patient is on Lovenox for DVT prophylaxis.     Rock Nephew, MD     NH/MEDQ  D:  07/31/2010  T:  07/31/2010  Job:  [id number]  cc:   Ramon Dredge L. Juanetta Gosling, M.D. Rollene Rotunda, MD, [facility] Eulogio Ditch, MD  Electronically Signed by Rock Nephew MD on 08/21/2010 12:30:29 PM

## 2010-09-05 NOTE — Discharge Summary (Signed)
  NAME:  [name] Johnson, [name] Johnson NO.:  [id number]  MEDICAL RECORD NO.:  [id number]  LOCATION:  4741                         FACILITY:  [facility]  PHYSICIAN:  Peggye Pitt, M.D. DATE OF BIRTH:  1940/05/11  DATE OF ADMISSION:  07/28/2010 DATE OF DISCHARGE:  08/03/2010                              DISCHARGE SUMMARY   ADDENDUM: Please note that this is an addendum to prior discharge summary simply to state that on the discharge medications, she was also discharged on clonazepam 0.5 mg 1 tablet by mouth 3 times a day in addition to other discharge medications and prior discharge summary.     Peggye Pitt, M.D.     EH/MEDQ  D:  08/30/2010  T:  08/30/2010  Job:  [id number]  Electronically Signed by Peggye Pitt M.D. on 09/05/2010 07:38:26 PM

## 2010-09-13 ENCOUNTER — Encounter: Payer: Self-pay | Admitting: Cardiology

## 2010-11-28 ENCOUNTER — Emergency Department (HOSPITAL_COMMUNITY)
Admission: EM | Admit: 2010-11-28 | Discharge: 2010-11-28 | Disposition: A | Discharge status: Home / Self Care | Payer: Medicare Other | Attending: Emergency Medicine | Admitting: Emergency Medicine

## 2010-11-28 ENCOUNTER — Other Ambulatory Visit: Payer: Self-pay

## 2010-11-28 ENCOUNTER — Encounter (HOSPITAL_COMMUNITY): Payer: Self-pay | Admitting: Emergency Medicine

## 2010-11-28 ENCOUNTER — Emergency Department (HOSPITAL_COMMUNITY): Payer: Medicare Other

## 2010-11-28 DIAGNOSIS — R569 Unspecified convulsions: Secondary | ICD-10-CM | POA: Insufficient documentation

## 2010-11-28 DIAGNOSIS — I252 Old myocardial infarction: Secondary | ICD-10-CM | POA: Insufficient documentation

## 2010-11-28 DIAGNOSIS — F209 Schizophrenia, unspecified: Secondary | ICD-10-CM | POA: Insufficient documentation

## 2010-11-28 DIAGNOSIS — I447 Left bundle-branch block, unspecified: Secondary | ICD-10-CM | POA: Insufficient documentation

## 2010-11-28 DIAGNOSIS — Z8673 Personal history of transient ischemic attack (TIA), and cerebral infarction without residual deficits: Secondary | ICD-10-CM | POA: Insufficient documentation

## 2010-11-28 DIAGNOSIS — F17200 Nicotine dependence, unspecified, uncomplicated: Secondary | ICD-10-CM | POA: Insufficient documentation

## 2010-11-28 DIAGNOSIS — R079 Chest pain, unspecified: Secondary | ICD-10-CM | POA: Insufficient documentation

## 2010-11-28 DIAGNOSIS — I10 Essential (primary) hypertension: Secondary | ICD-10-CM | POA: Insufficient documentation

## 2010-11-28 DIAGNOSIS — I509 Heart failure, unspecified: Secondary | ICD-10-CM | POA: Insufficient documentation

## 2010-11-28 LAB — CARDIAC PANEL(CRET KIN+CKTOT+MB+TROPI)
CK, MB: 4.8 ng/mL — ABNORMAL HIGH (ref 0.3–4.0)
Relative Index: 3.1 — ABNORMAL HIGH (ref 0.0–2.5)
Troponin I: <0.3 ng/mL (ref <0.30)

## 2010-11-28 LAB — BASIC METABOLIC PANEL
BUN: 16 mg/dL (ref 6–23)
Calcium: 9.9 mg/dL (ref 8.4–10.5)
Creatinine, Ser: 1.3 mg/dL — ABNORMAL HIGH (ref 0.50–1.10)
GFR calc non Af Amer: 41 mL/min — ABNORMAL LOW (ref >60)

## 2010-11-28 LAB — CBC
HCT: 40 % (ref 36.0–46.0)
MCH: 29.4 pg (ref 26.0–34.0)
MCHC: 34.3 g/dL (ref 30.0–36.0)
MCV: 85.8 fL (ref 78.0–100.0)
Platelets: 247 10*3/uL (ref 150–400)
RDW: 15.8 % — ABNORMAL HIGH (ref 11.5–15.5)
WBC: 5.6 10*3/uL (ref 4.0–10.5)

## 2010-11-28 LAB — POCT I-STAT TROPONIN I: Troponin i, poc: 0 ng/mL (ref 0.00–0.08)

## 2010-11-28 LAB — PRO B NATRIURETIC PEPTIDE: Pro B Natriuretic peptide (BNP): 14564 pg/mL — ABNORMAL HIGH (ref 0–125)

## 2010-11-28 MED ORDER — HYDROCODONE-ACETAMINOPHEN 5-325 MG PO TABS
1.0000 | ORAL_TABLET | Freq: Once | ORAL | Status: AC
  Administered 2010-11-28: 1 via ORAL
  Filled 2010-11-28: qty 1

## 2010-11-28 MED ORDER — HYDROCODONE-ACETAMINOPHEN 5-325 MG PO TABS: 1.0000 | ORAL_TABLET | ORAL | Status: AC | PRN

## 2010-11-28 NOTE — ED Notes (Signed)
Pt alert/oriented. Pt c/o center of chest hurting since yesterday that is sharp.  Had aspirin 324mg  en route.  cbg 154 e route. Pt nondiaphoretic.  Pt states she gets sob and nausea with the pain. Denies dizziness. Coughed up yellow phlegm on way in. Hx of MI. States pain to chest is " a lot " at this time.

## 2010-11-28 NOTE — ED Notes (Signed)
2 numbers called and no answer. Left message that pt is ready for d/c

## 2010-11-28 NOTE — ED Provider Notes (Signed)
History     CSN: [id number] Arrival date & time: 11/28/2010 12:41 PM   Chief Complaint  Patient presents with  . Chest Pain     (Include location/radiation/quality/duration/timing/severity/associated sxs/prior treatment) The history is provided by the patient and the EMS personnel. The history is limited by the condition of the patient.  PATIENT STATES CHEST PAIN ON OFF AND ABD PAIN ON OFF FOR ONE WEEK BUT ATLEAST SINCE YESTERDAY. IN ED IN NAD. NO DIAPHORESIS, NO VOMITING BUT STATES NAUSEATED IMPROVED WITH PAIN MEDS, EMS GAVE ASA ENROUTE.    Past Medical History  Diagnosis Date  . CHF (congestive heart failure)   . Hypertension   . Schizophrenia   . Leg pain   . SOB (shortness of breath)   . Stroke   . Myocardial infarction     Vague history  . Seizures   . Positive TB test      Past Surgical History  Procedure Date  . Abdominal surgery     Family History  Problem Relation Age of Onset  . Heart disease Other   . Hypertension Other     History  Substance Use Topics  . Smoking status: Current Everyday Smoker -- 1.0 packs/day  . Smokeless tobacco: Not on file  . Alcohol Use: No    OB History    Grav Para Term Preterm Abortions TAB SAB Ect Mult Living                  Review of Systems  Unable to perform ROS   Allergies  Review of patient's allergies indicates not on file.  Home Medications   Current Outpatient Rx  Name Route Sig Dispense Refill  . AMLODIPINE BESYLATE 10 MG PO TABS Oral Take 10 mg by mouth daily.      . ASPIRIN EC 325 MG PO TBEC Oral Take 325 mg by mouth daily.      Marland Kitchen CARVEDILOL 12.5 MG PO TABS Oral Take 12.5 mg by mouth 2 (two) times daily.      Marland Kitchen DIVALPROEX SODIUM 250 MG PO TBEC Oral Take 250 mg by mouth 3 (three) times daily.      . FUROSEMIDE 40 MG PO TABS Oral Take 40 mg by mouth daily.      Marland Kitchen HALOPERIDOL 5 MG PO TABS Oral Take 5 mg by mouth daily.      Marland Kitchen HYDRALAZINE HCL 50 MG PO TABS Oral Take 50 mg by mouth 2 (two) times  daily.      Marland Kitchen HYDROCODONE-ACETAMINOPHEN 5-500 MG PO TABS Oral Take 1 tablet by mouth every 6 (six) hours.      Marland Kitchen LISINOPRIL 5 MG PO TABS Oral Take 5 mg by mouth daily.      Marland Kitchen PANTOPRAZOLE SODIUM 40 MG PO TBEC Oral Take 40 mg by mouth daily.      Marland Kitchen POTASSIUM CHLORIDE CRYS CR 20 MEQ PO TBCR Oral Take 20 mEq by mouth daily.      Marland Kitchen VITAMIN B-12 1000 MCG PO TABS Oral Take 1,000 mcg by mouth daily.        Physical Exam    BP 150/107  Pulse 87  Temp(Src) 97.9 F (36.6 C) (Oral)  Resp 19  SpO2 100%  Physical Exam  Nursing note and vitals reviewed. Constitutional: She appears well-developed and well-nourished. No distress.  Eyes: EOM are normal. Pupils are equal, round, and reactive to light.  Neck: Normal range of motion. Neck supple.  Cardiovascular: Normal rate, regular rhythm and normal heart  sounds.   Pulmonary/Chest: Effort normal and breath sounds normal. She has no wheezes. She has no rales. She exhibits no tenderness.  Abdominal: Soft. Bowel sounds are normal. There is no tenderness.  Musculoskeletal: Normal range of motion. She exhibits no edema.  Neurological: She is alert. No cranial nerve deficit. She exhibits normal muscle tone.  Skin: No rash noted.    ED Course  Procedures  Results for orders placed during the hospital encounter of 07/28/10  DIFFERENTIAL      Component Value Range   Neutrophils Relative 78 (*) 43 - 77 (%)   Neutro Abs 4.8  1.7 - 7.7 (K/uL)   Lymphocytes Relative 20  12 - 46 (%)   Lymphs Abs 1.2  0.7 - 4.0 (K/uL)   Monocytes Relative 2 (*) 3 - 12 (%)   Monocytes Absolute 0.1  0.1 - 1.0 (K/uL)   Eosinophils Relative 0  0 - 5 (%)   Eosinophils Absolute 0.0  0.0 - 0.7 (K/uL)   Basophils Relative 0  0 - 1 (%)   Basophils Absolute 0.0  0.0 - 0.1 (K/uL)  CBC      Component Value Range   WBC 6.2  4.0 - 10.5 (K/uL)   RBC 4.78  3.87 - 5.11 (MIL/uL)   Hemoglobin 13.7  12.0 - 15.0 (g/dL)   HCT 16.1  09.6 - 04.5 (%)   MCV 84.9  78.0 - 100.0 (fL)   MCH  28.7  26.0 - 34.0 (pg)   MCHC 33.7  30.0 - 36.0 (g/dL)   RDW 40.9  81.1 - 91.4 (%)   Platelets 261  150 - 400 (K/uL)  CARDIAC PANEL(CRET KIN+CKTOT+MB+TROPI)      Component Value Range   Total CK 251 (*) 7 - 177 (U/L)   CK, MB 5.7 (*) 0.3 - 4.0 (ng/mL)   Troponin I    <0.30 (ng/mL)   Value: <0.30            Due to the release kinetics of cTnI,     a negative result within the first hours     of the onset of symptoms does not rule out     myocardial infarction with certainty.     If myocardial infarction is still suspected,     repeat the test at appropriate intervals. **Please note change in reference range.**   Relative Index 2.3  0.0 - 2.5   COMPREHENSIVE METABOLIC PANEL      Component Value Range   Sodium 139  135 - 145 (mEq/L)   Potassium 4.0  3.5 - 5.1 (mEq/L)   Chloride 102  96 - 112 (mEq/L)   CO2 27  19 - 32 (mEq/L)   Glucose, Bld 125 (*) 70 - 99 (mg/dL)   BUN 11  6 - 23 (mg/dL)   Creatinine, Ser 7.82  0.4 - 1.2 (mg/dL)   Calcium 8.8  8.4 - 95.6 (mg/dL)   Total Protein 6.7  6.0 - 8.3 (g/dL)   Albumin 3.3 (*) 3.5 - 5.2 (g/dL)   AST 19  0 - 37 (U/L)   ALT 14  0 - 35 (U/L)   Alkaline Phosphatase 81  39 - 117 (U/L)   Total Bilirubin 0.1 (*) 0.3 - 1.2 (mg/dL)   GFR calc non Af Amer >60  >60 (mL/min)   GFR calc Af Amer    >60 (mL/min)   Value: >60            The eGFR has been calculated  using the MDRD equation.     This calculation has not been     validated in all clinical     situations.     eGFR's persistently     <60 mL/min signify     possible Chronic Kidney Disease.  MAGNESIUM      Component Value Range   Magnesium 1.9  1.5 - 2.5 (mg/dL)  PHOSPHORUS      Component Value Range   Phosphorus 3.4  2.3 - 4.6 (mg/dL)  LIPID PANEL      Component Value Range   Cholesterol 149  0 - 200 (mg/dL)   Triglycerides 52  <161 (mg/dL)   HDL 62  >09 (mg/dL)   Total CHOL/HDL Ratio 2.4     VLDL 10  0 - 40 (mg/dL)   LDL Cholesterol    0 - 99 (mg/dL)   Value: 77             Total Cholesterol/HDL:CHD Risk     Coronary Heart Disease Risk Table                         Men   Women      1/2 Average Risk   3.4   3.3      Average Risk       5.0   4.4      2 X Average Risk   9.6   7.1      3 X Average Risk  23.4   11.0                Use the calculated Patient Ratio     above and the CHD Risk Table     to determine the patient's CHD Risk.                ATP III CLASSIFICATION (LDL):      <100     mg/dL   Optimal      604-540  mg/dL   Near or Above                        Optimal      130-159  mg/dL   Borderline      981-191  mg/dL   High      >478     mg/dL   Very High  PRO B NATRIURETIC PEPTIDE      Component Value Range   BNP, POC 12162.0 (*) 0 - 125 (pg/mL)  TSH      Component Value Range   TSH 0.579  0.350 - 4.500 (uIU/mL)  CARDIAC PANEL(CRET KIN+CKTOT+MB+TROPI)      Component Value Range   Total CK 289 (*) 7 - 177 (U/L)   CK, MB 6.0 (*) 0.3 - 4.0 (ng/mL)   Troponin I    <0.30 (ng/mL)   Value: <0.30            Due to the release kinetics of cTnI,     a negative result within the first hours     of the onset of symptoms does not rule out     myocardial infarction with certainty.     If myocardial infarction is still suspected,     repeat the test at appropriate intervals. **Please note change in reference range.**   Relative Index 2.1  0.0 - 2.5   BASIC METABOLIC PANEL      Component Value Range   Sodium 134 (*)  135 - 145 (mEq/L)   Potassium 3.9  3.5 - 5.1 (mEq/L)   Chloride 96  96 - 112 (mEq/L)   CO2 28  19 - 32 (mEq/L)   Glucose, Bld 84  70 - 99 (mg/dL)   BUN 20  6 - 23 (mg/dL)   Creatinine, Ser 1.61  0.4 - 1.2 (mg/dL)   Calcium 8.9  8.4 - 09.6 (mg/dL)   GFR calc non Af Amer >60  >60 (mL/min)   GFR calc Af Amer    >60 (mL/min)   Value: >60            The eGFR has been calculated     using the MDRD equation.     This calculation has not been     validated in all clinical     situations.     eGFR's persistently     <60 mL/min signify      possible Chronic Kidney Disease.  MAGNESIUM      Component Value Range   Magnesium 2.1  1.5 - 2.5 (mg/dL)  PHOSPHORUS      Component Value Range   Phosphorus 4.1  2.3 - 4.6 (mg/dL)  BASIC METABOLIC PANEL      Component Value Range   Sodium 131 (*) 135 - 145 (mEq/L)   Potassium 4.1  3.5 - 5.1 (mEq/L)   Chloride 93 (*) 96 - 112 (mEq/L)   CO2 27  19 - 32 (mEq/L)   Glucose, Bld 107 (*) 70 - 99 (mg/dL)   BUN 17  6 - 23 (mg/dL)   Creatinine, Ser 0.45  0.4 - 1.2 (mg/dL)   Calcium 9.6  8.4 - 40.9 (mg/dL)   GFR calc non Af Amer >60  >60 (mL/min)   GFR calc Af Amer    >60 (mL/min)   Value: >60            The eGFR has been calculated     using the MDRD equation.     This calculation has not been     validated in all clinical     situations.     eGFR's persistently     <60 mL/min signify     possible Chronic Kidney Disease.  MAGNESIUM      Component Value Range   Magnesium 2.1  1.5 - 2.5 (mg/dL)  PHOSPHORUS      Component Value Range   Phosphorus 3.8  2.3 - 4.6 (mg/dL)    Date: 81/19/1478  Rate: 85  Rhythm: normal sinus rhythm and premature ventricular contractions (PVC)  QRS Axis: left  Intervals: normal  ST/T Wave abnormalities: nonspecific ST/T changes  Conduction Disutrbances:left bundle branch block  Narrative Interpretation:   Old EKG Reviewed: unchanged From 11/30/09    MDM WORK UP NEGATIVE NO SIG CHF, NO LE SWELLING NO HYPOXIA. HX OF SCHIZOPHRENIA SO HX DIFFICULT BUT PAIN AT LEAST PRESENT SINCE YESTERDAY. C/O INCLUDES CHEST PAIN SOMETIMES AND ABDOMINAL PAIN AT OTHER TIMES. ABD SOFT NT. AND CARDIAC TN NEGATIVE X 2 AND EKG WITHOUT SIG CHANGE OLD LBBB. NOTED ELEVATED CKMB BUT 2 TN I NEGATIVE. NOT CW ACUTE CARDIAC EVENT.    IMP: CHEST PAIN.   D/C HOME FOLLOW UP WITH YOUR DOCTOR.     Shelda Jakes, MD 11/28/10 [phone]

## 2011-01-25 ENCOUNTER — Ambulatory Visit (INDEPENDENT_AMBULATORY_CARE_PROVIDER_SITE_OTHER): Payer: Medicare Other | Admitting: Cardiovascular Disease

## 2011-01-25 ENCOUNTER — Encounter: Payer: Self-pay | Admitting: Cardiovascular Disease

## 2011-01-25 VITALS — BP 117/79 | HR 92 | Ht 63.0 in | Wt 121.0 lb

## 2011-01-25 DIAGNOSIS — I43 Cardiomyopathy in diseases classified elsewhere: Secondary | ICD-10-CM

## 2011-01-25 DIAGNOSIS — F205 Residual schizophrenia: Secondary | ICD-10-CM

## 2011-01-25 DIAGNOSIS — I5022 Chronic systolic (congestive) heart failure: Secondary | ICD-10-CM

## 2011-01-25 DIAGNOSIS — F17200 Nicotine dependence, unspecified, uncomplicated: Secondary | ICD-10-CM

## 2011-01-25 MED ORDER — LISINOPRIL 5 MG PO TABS: 5.0000 mg | ORAL_TABLET | Freq: Every day | ORAL | Status: AC

## 2011-01-25 NOTE — Assessment & Plan Note (Signed)
Followed by Dr. Qureshi. 

## 2011-01-25 NOTE — Assessment & Plan Note (Signed)
Presumed non ischemic, by prior nuclear imaging study, 7/11. Recommend continued conservative management.

## 2011-01-25 NOTE — Assessment & Plan Note (Signed)
Currently no longer smoking, now that she's a resident at [facility].

## 2011-01-25 NOTE — Patient Instructions (Signed)
Your physician wants you to follow-up in: 3 months. You will receive a reminder letter in the mail one-two months in advance. If you don't receive a letter, please call our office to schedule the follow-up appointment. Start Lisinopril 5 mg daily. Lab in 1 week. (BMET)

## 2011-01-25 NOTE — Assessment & Plan Note (Signed)
Patient appears euvolemic by presentation. Will add ACE inhibitor with lisinopril 5 mg daily, with followup BMET in 1 week. Do not recommend resumption of Aldactone (added during recent hospitalization), given patient's underlying medical condition.

## 2011-01-25 NOTE — Progress Notes (Signed)
cc:  HPI: Patient presents for post hospital followup, and to reestablish here in our clinic. She was last seen here in July 2011, by Dr. Antoine Poche, for preoperative cardiac clearance. 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 Her last echocardiogram, 5/12, at [facility], yielded EF 30-35%, with no significant valvular abnormalities. This compares to a previous study here at [facility], 4/11, yielding EF 20-25%, with restrictive filling pattern, mild MR, and pulmonary hypertension (RVSP 45 mmHg). She has no known history of CAD, by prior cardiac catheterization.  Patient presents from [facility], with her driver. The patient, herself, is unable to provide any meaningful history. She is slouched in her wheelchair, but appears stable. Her minimal speech is inarticulate.  PMH: reviewed and listed in Problem List in electronic Records (and see below)  Allergies/SH/FH: available in Electronic Records for review  Current Outpatient Prescriptions  Medication Sig Dispense Refill  . albuterol (PROVENTIL) (2.5 MG/3ML) 0.083% nebulizer solution Take 2.5 mg by nebulization every 4 (four) hours as needed.        Marland Kitchen aspirin EC 81 MG tablet Take 81 mg by mouth daily.        . carvedilol (COREG) 6.25 MG tablet Take 6.25 mg by mouth 2 (two) times daily with a meal.        . divalproex (DEPAKOTE) 250 MG EC tablet Take 250 mg by mouth 3 (three) times daily.        . fluPHENAZine decanoate (PROLIXIN) 25 MG/ML injection Inject 50 mg into the muscle every 14 (fourteen) days.        . haloperidol (HALDOL) 5 MG tablet Take 5 mg by mouth daily.       Marland Kitchen LORazepam (ATIVAN) 1 MG tablet Take 1 mg by mouth every 6 (six) hours as needed.        Marland Kitchen oxyCODONE-acetaminophen (PERCOCET) 5-325 MG per tablet Take 1 tablet by mouth every 6 (six) hours as needed.        . sertraline  (ZOLOFT) 25 MG tablet Take 25 mg by mouth daily.        Marland Kitchen tiotropium (SPIRIVA) 18 MCG inhalation capsule Place 18 mcg into inhaler and inhale daily.        Marland Kitchen torsemide (DEMADEX) 20 MG tablet Take 40 mg by mouth daily.          ROS: no nausea, vomiting; no fever, chills; no melena, hematochezia; no claudication  PHYSICAL EXAM:  There were no vitals taken for this visit.  GENERAL: 70 year old female, frail appearing, seated in a wheelchair; NAD HEENT: NCAT, PERRLA, EOMI; sclera clear; no xanthelasma; edentulous NECK: palpable bilateral carotid pulses, no bruits; no JVD; no TM LUNGS: CTA bilaterally CARDIAC: RRR (S1, S2); no significant murmurs; no rubs or gallops ABDOMEN: soft, non-tender; intact BS EXTREMETIES: Distal extremities cool to touch, with 1+ bilateral peripheral edema SKIN: warm/dry; no obvious rash/lesions MUSCULOSKELETAL: no joint deformity NEURO: Somnolent   EKG:    ASSESSMENT & PLAN:

## 2011-01-28 NOTE — Progress Notes (Signed)
I have seen and examined the patient. I agree with the above note with the addition of : the patient does not seem to have any understanding of her medical conditions and seems to have significant psychiatric history. Thus, I recommend a conservative approach and avoiding invasive management including cardiac cath and ICD. Due to taking multiple medications, she is at risk for hyperkalemia with Spirolactone which will be avoided. Resume Lisinopril.    Lorine Bears 01/25/2011

## 2011-03-15 ENCOUNTER — Encounter: Payer: Self-pay | Admitting: *Deleted

## 2011-04-02 ENCOUNTER — Encounter (HOSPITAL_COMMUNITY): Payer: Self-pay | Admitting: Emergency Medicine

## 2011-04-02 ENCOUNTER — Emergency Department (HOSPITAL_COMMUNITY)
Admission: EM | Admit: 2011-04-02 | Discharge: 2011-04-02 | Disposition: A | Discharge status: Other Acute Inpatient Hospital | Payer: Medicare Other | Attending: Emergency Medicine | Admitting: Emergency Medicine

## 2011-04-02 DIAGNOSIS — F209 Schizophrenia, unspecified: Secondary | ICD-10-CM | POA: Insufficient documentation

## 2011-04-02 DIAGNOSIS — I10 Essential (primary) hypertension: Secondary | ICD-10-CM | POA: Insufficient documentation

## 2011-04-02 DIAGNOSIS — Z79899 Other long term (current) drug therapy: Secondary | ICD-10-CM | POA: Insufficient documentation

## 2011-04-02 DIAGNOSIS — R0602 Shortness of breath: Secondary | ICD-10-CM | POA: Insufficient documentation

## 2011-04-02 DIAGNOSIS — I252 Old myocardial infarction: Secondary | ICD-10-CM | POA: Insufficient documentation

## 2011-04-02 DIAGNOSIS — I509 Heart failure, unspecified: Secondary | ICD-10-CM | POA: Insufficient documentation

## 2011-04-02 NOTE — ED Notes (Signed)
Pt DC in police custody to be taken to [place] per Police original orders

## 2011-04-02 NOTE — ED Notes (Signed)
Examination and recommendation faxed to [place] at [place] [phone] fax). [name] Johnson states patient has been medically cleared from work up at [facility] and all paperwork is in order. Pt may be transferred to bed 416B. Phone [phone]).

## 2011-04-02 NOTE — ED Provider Notes (Signed)
History   This chart was scribed for Joya Gaskins, MD by Clarita Crane. The patient was seen in room APAH8/APAH8 and the patient's care was started at 5:17PM.   CSN: [id number]  Arrival date & time 04/02/11  1657   First MD Initiated Contact with Patient 04/02/11 1713      Chief Complaint  Patient presents with  . Medical Clearance     HPI [name] Johnson is a 71 y.o. female who presents to the Emergency Department accompanied by law enforcement to obtain medical clearance prior to transfer to [facility]. Patient currently expresses no complaints and explicitly denies chest pain, SOB, abdominal pain, HA, nausea, vomiting. Patient with h/o schizophrenia, CHF, HTN, stroke, MI and seizures.  Onset - unknown time ago Course - worsening   Past Medical History  Diagnosis Date  . CHF (congestive heart failure)   . Hypertension   . Schizophrenia   . Leg pain   . SOB (shortness of breath)   . Stroke   . Myocardial infarction     Vague history  . Seizures   . Positive TB test     Past Surgical History  Procedure Date  . Abdominal surgery     Family History  Problem Relation Age of Onset  . Heart disease Other   . Hypertension Other     History  Substance Use Topics  . Smoking status: Former Smoker -- 1.0 packs/day    Types: Cigarettes  . Smokeless tobacco: Not on file  . Alcohol Use: No    OB History    Grav Para Term Preterm Abortions TAB SAB Ect Mult Living                  Review of Systems 10 Systems reviewed and are negative for acute change except as noted in the HPI.  Allergies  Ibuprofen; Nsaids; and Tuberculin tests  Home Medications   Current Outpatient Rx  Name Route Sig Dispense Refill  . ALBUTEROL SULFATE (2.5 MG/3ML) 0.083% IN NEBU Nebulization Take 2.5 mg by nebulization every 4 (four) hours as needed.      . ASPIRIN EC 81 MG PO TBEC Oral Take 81 mg by mouth daily.      Marland Kitchen CARVEDILOL 6.25 MG PO TABS Oral Take 6.25  mg by mouth 2 (two) times daily with a meal.      . DIVALPROEX SODIUM 250 MG PO TBEC Oral Take 250 mg by mouth 3 (three) times daily.      Marland Kitchen FLUPHENAZINE DECANOATE 25 MG/ML IJ SOLN Intramuscular Inject 50 mg into the muscle every 14 (fourteen) days.      Marland Kitchen HALOPERIDOL 5 MG PO TABS Oral Take 5 mg by mouth daily.     Marland Kitchen LISINOPRIL 5 MG PO TABS Oral Take 1 tablet (5 mg total) by mouth daily. 30 tablet 11  . LORAZEPAM 1 MG PO TABS Oral Take 1 mg by mouth every 6 (six) hours as needed.      . OXYCODONE-ACETAMINOPHEN 5-325 MG PO TABS Oral Take 1 tablet by mouth every 6 (six) hours as needed.      . SERTRALINE HCL 25 MG PO TABS Oral Take 25 mg by mouth daily.      Marland Kitchen TIOTROPIUM BROMIDE MONOHYDRATE 18 MCG IN CAPS Inhalation Place 18 mcg into inhaler and inhale daily.      . TORSEMIDE 20 MG PO TABS Oral Take 40 mg by mouth daily.        BP  146/98  Pulse 80  Temp(Src) 98.6 F (37 C) (Oral)  Resp 20  SpO2 100%  Physical Exam CONSTITUTIONAL: Well developed/well nourished HEAD AND FACE: Normocephalic/atraumatic EYES: cataracts noted ENMT: Mucous membranes moist NECK: supple no meningeal signs SPINE:entire spine nontender CV: S1/S2 noted, no murmurs/rubs/gallops noted LUNGS: Lungs are clear to auscultation bilaterally, no apparent distress ABDOMEN: soft, nontender, no rebound or guarding NEURO: Pt is awake/alert, moves all extremitiesx4 EXTREMITIES: pulses normal, full ROM SKIN: warm, color normal PSYCH: no abnormalities of mood noted  ED Course  Procedures DIAGNOSTIC STUDIES: Oxygen Saturation is 100% on room air, normal by my interpretation.    COORDINATION OF CARE: 5:17PM- Joya Gaskins, MD speaks with accompanying law enforcemcent regarding situation regarding patient and transfer to psychiatric facility.   Pt already accepted to [facility], has bed available as was arranged by med director at her nursing facility      MDM  Nursing notes reviewed and considered in  documentation       I personally performed the services described in this documentation, which was scribed in my presence. The recorded information has been reviewed and considered.      Joya Gaskins, MD 04/02/11 [phone]

## 2011-04-02 NOTE — ED Notes (Signed)
Pt sent from [facility] for shizophrenia. Pt has been agitated with staff and other residents. Pt states she hears voices that were telling her to do things per the petition.

## 2011-04-30 ENCOUNTER — Encounter: Payer: Self-pay | Admitting: *Deleted

## 2011-04-30 NOTE — Progress Notes (Signed)
Patient ID: [name] Johnson, female   DOB: [birth year], 71 y.o.   MRN: [id number]  Certified letter mailed to pt regarding BMET that was due in November.

## 2011-05-28 NOTE — Progress Notes (Signed)
Patient ID: [name] Johnson, female   DOB: [birth year], 71 y.o.   MRN: [id number] Certified letter returned 05/20/11 "unable to forward for review."

## 2011-12-22 IMAGING — CR DG CHEST 1V PORT
1 series · 1 of 1 positions shown · non-contrast
Comparison: 07/28/2010.

CLINICAL DATA: Shortness of breath and cough.

PORTABLE CHEST - 1 VIEW

[view not recorded]
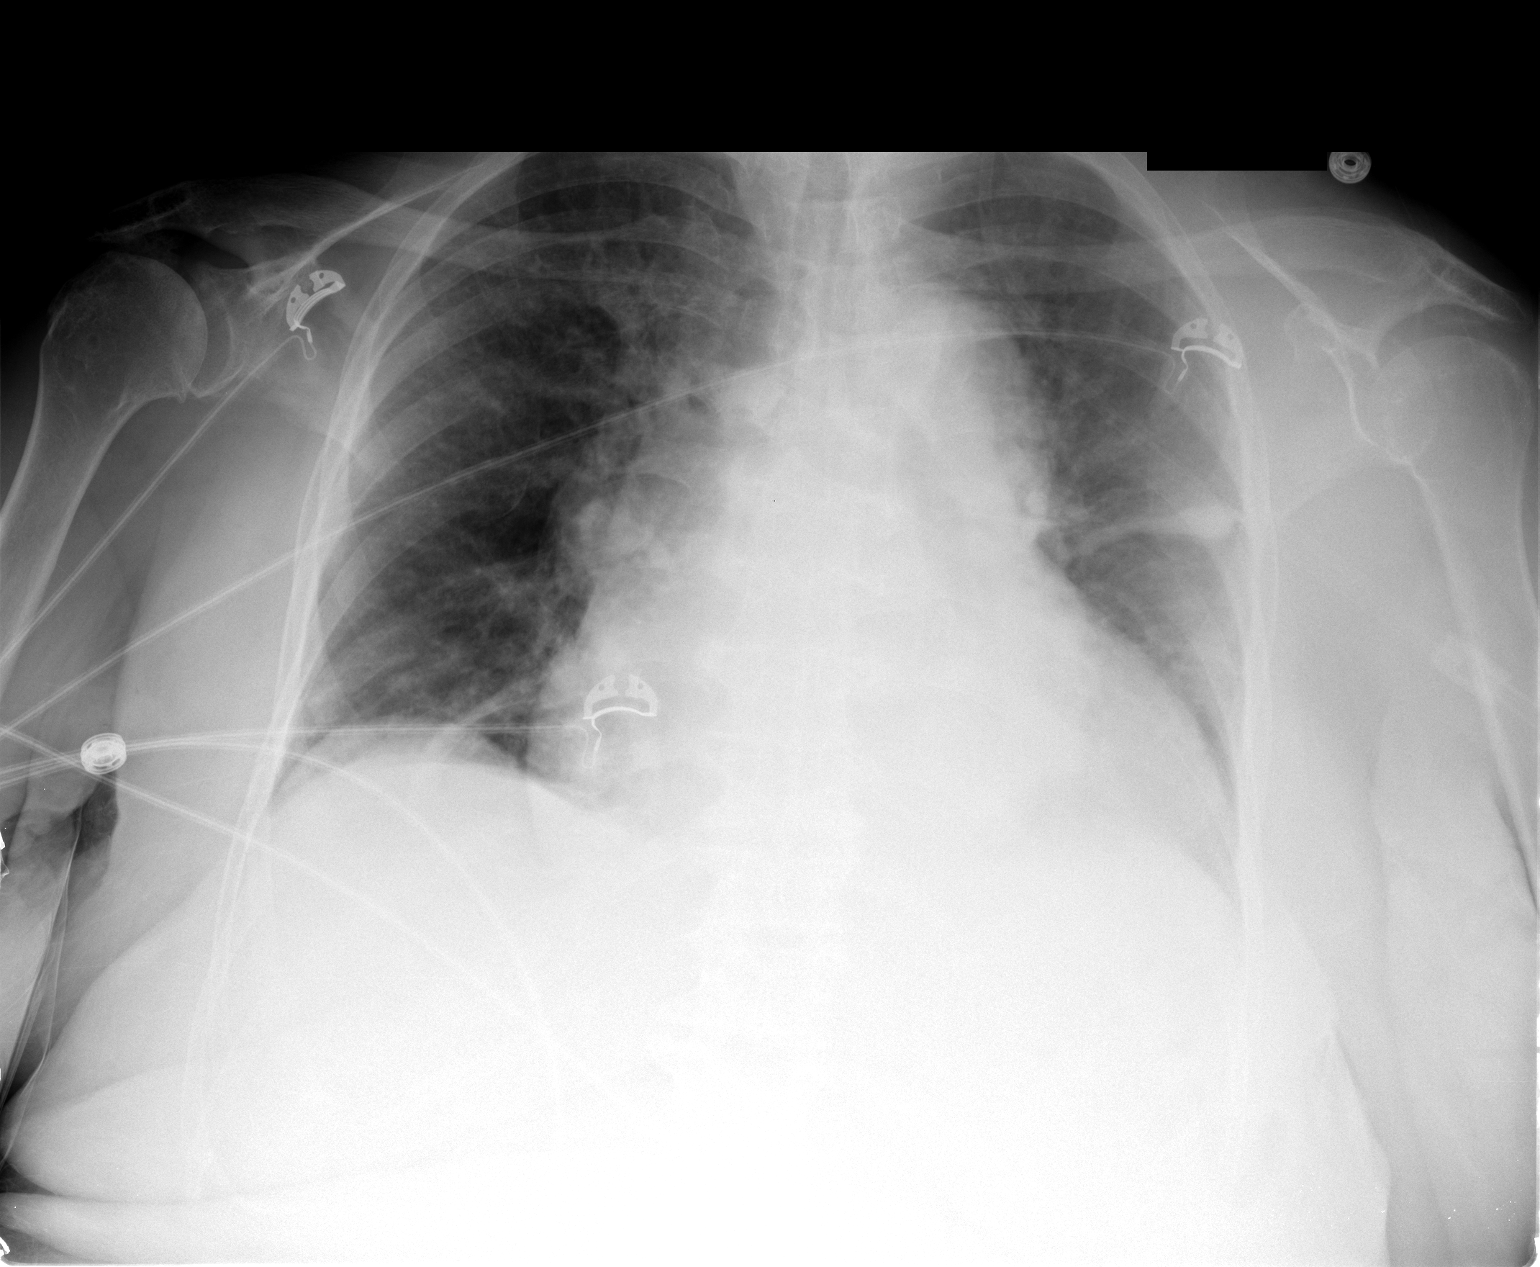

[1 of 1 positions shown; findings below may reference images not displayed]

FINDINGS: Trachea is midline.  Heart is enlarged.  Thoracic aorta
is calcified.  There is mild interstitial prominence and
indistinctness.  Linear densities are seen in the left midlung zone
and right lower lobe.  No pleural fluid.
IMPRESSION: Interstitial prominence and indistinctness may be due to mild edema
or vascular crowding.  Probable scattered subsegmental atelectasis
bilaterally.

## 2012-12-07 ENCOUNTER — Other Ambulatory Visit: Payer: Self-pay | Admitting: *Deleted

## 2012-12-07 MED ORDER — OXYMORPHONE HCL 5 MG PO TABS
ORAL_TABLET | ORAL | Status: AC
Start: 2012-12-07 — End: ?

## 2014-12-24 ENCOUNTER — Other Ambulatory Visit
Admission: RE | Admit: 2014-12-24 | Discharge: 2014-12-24 | Disposition: A | Discharge status: Home / Self Care | Payer: Medicare Other | Source: Ambulatory Visit | Attending: Nurse Practitioner | Admitting: Nurse Practitioner

## 2014-12-24 DIAGNOSIS — Z029 Encounter for administrative examinations, unspecified: Secondary | ICD-10-CM | POA: Insufficient documentation

## 2014-12-24 LAB — CBC
HEMATOCRIT: 38.7 % (ref 35.0–47.0)
Hemoglobin: 12.3 g/dL (ref 12.0–16.0)
MCH: 28.6 pg (ref 26.0–34.0)
MCHC: 31.8 g/dL — ABNORMAL LOW (ref 32.0–36.0)
MCV: 89.8 fL (ref 80.0–100.0)
Platelets: 196 10*3/uL (ref 150–440)
RBC: 4.31 MIL/uL (ref 3.80–5.20)
RDW: 17.9 % — AB (ref 11.5–14.5)
WBC: 6.4 10*3/uL (ref 3.6–11.0)

## 2014-12-24 LAB — BASIC METABOLIC PANEL
ANION GAP: 14 (ref 5–15)
BUN: 32 mg/dL — ABNORMAL HIGH (ref 6–20)
CALCIUM: 9.5 mg/dL (ref 8.9–10.3)
CO2: 27 mmol/L (ref 22–32)
Chloride: 105 mmol/L (ref 101–111)
Creatinine, Ser: 1.28 mg/dL — ABNORMAL HIGH (ref 0.44–1.00)
GFR, EST AFRICAN AMERICAN: 47 mL/min — AB (ref >60)
GFR, EST NON AFRICAN AMERICAN: 40 mL/min — AB (ref >60)
Glucose, Bld: 191 mg/dL — ABNORMAL HIGH (ref 65–99)
POTASSIUM: 4.2 mmol/L (ref 3.5–5.1)
Sodium: 146 mmol/L — ABNORMAL HIGH (ref 135–145)

## 2015-05-01 ENCOUNTER — Encounter: Payer: Self-pay | Admitting: Nurse Practitioner

## 2015-05-11 ENCOUNTER — Encounter: Payer: Self-pay | Admitting: Nurse Practitioner

## 2015-05-11 ENCOUNTER — Ambulatory Visit (INDEPENDENT_AMBULATORY_CARE_PROVIDER_SITE_OTHER): Payer: Medicare Other | Admitting: Nurse Practitioner

## 2015-05-11 VITALS — BP 129/91 | HR 74 | Temp 97.4°F | Ht 63.0 in

## 2015-05-11 DIAGNOSIS — F0390 Unspecified dementia without behavioral disturbance: Secondary | ICD-10-CM

## 2015-05-11 DIAGNOSIS — R195 Other fecal abnormalities: Secondary | ICD-10-CM | POA: Diagnosis not present

## 2015-05-11 DIAGNOSIS — F03C0 Unspecified dementia, severe, without behavioral disturbance, psychotic disturbance, mood disturbance, and anxiety: Secondary | ICD-10-CM

## 2015-05-11 NOTE — Progress Notes (Addendum)
Primary Care Physician:  Colon Branch, MD Primary Gastroenterologist:  Dr. Darrick Penna  Chief Complaint  Patient presents with  . positive blood in stool    HPI:   [name] Johnson is a 75 y.o. female who presents for positive iFob at [facility] nursing home. The patient has advanced dementia and per PCP note was unable to reliably participate in review of systems because of this. Also with a noted history of chronic oral pharyngeal phase dysphagia due to cognitive deficits associated with dementia trajectory, no weight loss in the previous month, no reports of coughing or choking with meals. No indication and notes of last colonoscopy and/or endoscopy. No colonoscopy or endoscopy notes found in our system.  Patient is a very poor historian due to comorbidities including advanced dementia. Is relatively unable to participate in ROS. Did admit to abdominal pain when she gets out of bed. Asked if she has seen any blood and replies "a whole lot." Asked when she saw blood, and subsequent attempted follow-up questions all responded to with "a whole lot." Had a brief episode of crying which stopped suddenly when taking about her having a good heart. She is accompanied by facility transportation staff who is not familiar with any clinical details of the patient.  Past Medical History  Diagnosis Date  . CHF (congestive heart failure) (HCC)   . Hypertension   . Schizophrenia (HCC)   . Leg pain   . SOB (shortness of breath)   . Stroke (HCC)   . Myocardial infarction [facility])     Vague history  . Seizures (HCC)   . Positive TB test     Past Surgical History  Procedure Laterality Date  . Abdominal surgery      Current Outpatient Prescriptions  Medication Sig Dispense Refill  . albuterol (PROVENTIL) (2.5 MG/3ML) 0.083% nebulizer solution Take 2.5 mg by nebulization every 4 (four) hours as needed.      Marland Kitchen allopurinol (ZYLOPRIM) 300 MG tablet Take 300 mg by mouth daily.    Marland Kitchen arformoterol  (BROVANA) 15 MCG/2ML NEBU Take 15 mcg by nebulization.    . ARTIFICIAL TEAR OP Apply to eye.    . carvedilol (COREG) 6.25 MG tablet Take 6.25 mg by mouth 2 (two) times daily with a meal.      . Cholecalciferol (VITAMIN D-3 PO) Take 50,000 Units by mouth.    . divalproex (DEPAKOTE) 250 MG EC tablet Take 250 mg by mouth 3 (three) times daily.      . haloperidol (HALDOL) 5 MG tablet Take 5 mg by mouth daily.     Marland Kitchen levothyroxine (SYNTHROID, LEVOTHROID) 100 MCG tablet Take 100 mcg by mouth daily before breakfast.    . LORazepam (ATIVAN) 1 MG tablet Take 1 mg by mouth every 6 (six) hours as needed (1/2 tab).     . multivitamin with minerals (CERTA-VITE) LIQD Take 5 mLs by mouth daily.    Marland Kitchen oxymorphone (OPANA) 5 MG tablet Take one tablet by mouth every 12 hours for pain 60 tablet 0  . sertraline (ZOLOFT) 25 MG tablet Take 25 mg by mouth daily.      Marland Kitchen aspirin EC 81 MG tablet Take 81 mg by mouth daily. Reported on 05/11/2015    . fluPHENAZine decanoate (PROLIXIN) 25 MG/ML injection Inject 50 mg into the muscle every 14 (fourteen) days. Reported on 05/11/2015    . lisinopril (PRINIVIL,ZESTRIL) 5 MG tablet Take 1 tablet (5 mg total) by mouth daily. 30 tablet 11  .  oxyCODONE-acetaminophen (PERCOCET) 5-325 MG per tablet Take 1 tablet by mouth every 6 (six) hours as needed. Reported on 05/11/2015    . tiotropium (SPIRIVA) 18 MCG inhalation capsule Place 18 mcg into inhaler and inhale daily. Reported on 05/11/2015    . torsemide (DEMADEX) 20 MG tablet Take 40 mg by mouth daily. Reported on 05/11/2015     No current facility-administered medications for this visit.    Allergies as of 05/11/2015 - Review Complete 05/11/2015  Allergen Reaction Noted  . Ibuprofen  01/25/2011  . Nsaids  01/25/2011  . Tuberculin tests  01/25/2011    Family History  Problem Relation Age of Onset  . Heart disease Other   . Hypertension Other     Social History   Social History  . Marital Status: Divorced    Spouse Name: N/A  .  Number of Children: N/A  . Years of Education: N/A   Occupational History  . Not on file.   Social History Main Topics  . Smoking status: Former Smoker -- 1.00 packs/day    Types: Cigarettes  . Smokeless tobacco: Not on file  . Alcohol Use: No  . Drug Use: No  . Sexual Activity: Not on file   Other Topics Concern  . Not on file   Social History Narrative   Single.   Lives at home.    Review of Systems: Unable to participate due to advanced dementia except as per in HPI.    Physical Exam: BP 129/91 mmHg  Pulse 74  Temp(Src) 97.4 F (36.3 C)  Ht  (1.6 m)  Wt  General:   Alert. Pleasant and cooperative, smiles at times, tearful for less than a minute. Sitting in wheelchair.  Head:  Normocephalic and atraumatic. Eyes:  Without icterus, sclera clear and conjunctiva pink.  Cardiovascular:  S1, S2 present without murmurs appreciated. Extremities without clubbing or edema. Respiratory:  Clear to auscultation bilaterally. No wheezes, rales, or rhonchi. No distress.  Gastrointestinal:  +BS, soft, and non-distended. No HSM noted. No guarding or rebound. Rectal:  Deferred  Musculoskalatal:  Symmetrical without gross deformities. Skin:  Intact without significant lesions or rashes. Neurologic:  Alert; Unable to answer most questions consistent with dementia history. Psych:  Alert and cooperative. Heme/Lymph/Immune: No excessive bruising noted.    05/11/2015 11:04 AM   Disclaimer: This note was dictated with voice recognition software. Similar sounding words can inadvertently be transcribed and may not be corrected upon review.

## 2015-05-11 NOTE — Assessment & Plan Note (Addendum)
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 Patient is a very poor historian due to her advanced dementia, facility person accompanying patient has no knowledge of clinical details regarding the patient. Patient is essentially minimally to non-participate oriented in review of systems. No labs provided with paperwork from facility. Chief complaint and referral order states heme positive stool, and no details about this were provided. At this point I will check a CBC. Otherwise, she is likely not a candidate for any sort of endoscopic evaluation. Likely unable to tolerate or take bowel prep, question risks of sedation with her comorbidities and multiple medications for them. I will attempt to contact the facility and patient's family for more details and to discuss wishes for extent of further evaluation.

## 2015-05-11 NOTE — Patient Instructions (Signed)
1. We will contact the facility for further clinical details. 2. We will contact family to discuss wishes for extensive further evaluation. 3. We will notify the facility of further recommendations when they become apparent after completing the above. 4. We will order a CBC check for anemia.

## 2015-05-12 NOTE — Assessment & Plan Note (Signed)
The patient is minimally able to participate in the visit due to advanced dementia. Provide to same answered multiple questions as per history of present illness. Was briefly tearful but then return to smiling after distraction. Staph accompanying patient unable to answer clinical questions related to the patient. We'll follow-up with staff and family for further recommendations.

## 2015-05-16 NOTE — Progress Notes (Signed)
cc'ed to pcp °

## 2015-05-16 NOTE — Progress Notes (Signed)
CBC results received and essentially normal H/H 11.6/35.9.

## 2015-05-16 NOTE — Progress Notes (Signed)
Discussed situation with Cherrie GauzeKim Burkhart, NP at the facility. She doesn't feel the patient would tolerate a bowel prep and she is not sure of the goals of doing a procedure. H/H stable. This was explained to the patient's family by Selena BattenKim, but they wanted a consult to get our opinion.  I agree that she is unlikely to tolerate the prep and is likely a higher risk of complications with medical history and meds. If something significant was found, doubtful she would be a candidate for extensive treatment. At this point continue to monitor.

## 2015-05-28 ENCOUNTER — Encounter: Payer: Self-pay | Admitting: Gastroenterology

## 2015-05-28 NOTE — Telephone Encounter (Signed)
ERROR

## 2015-05-28 NOTE — Progress Notes (Signed)
REVIEWED.  AGREE PT LIKELY WILL NOT BENEFIT FORM COLONOSCOPY. SHE WILL LIKLEY NOT BE ABLE TO COMPETE A BOWEL PREP FROM DUE TO ADVANCED DEMENTIA.  HER OTHER CO-MORBIDITIES MOST LIKELY LIMIT PTS LIFE EXPECTANCY.  IN OCT 2016 SHE HAD A NORMAL Hb. SHE IS ON ASA WITHOUT A PPI. CONTINUE ASA DUE TO CAD. ADD OMEPRAZOLE DAILY.

## 2015-06-01 ENCOUNTER — Telehealth: Payer: Self-pay | Admitting: Nurse Practitioner

## 2015-06-01 MED ORDER — OMEPRAZOLE 20 MG PO CPDR: 20.0000 mg | DELAYED_RELEASE_CAPSULE | Freq: Every day | ORAL | Status: AC

## 2015-06-01 NOTE — Telephone Encounter (Signed)
Please notify the patient/facility. Dr. Darrick PennaFields agrees patient not a candidate for colonoscopy. Recommends adding daily omeprazole, which I have sent to the pharmacy of record. Call with any questions.

## 2015-06-05 NOTE — Telephone Encounter (Signed)
Called. Many rings and no answer.  

## 2015-06-07 NOTE — Telephone Encounter (Signed)
I called the facility and spoke to nurse, Bonnetta BarryHeather Standly.  She is aware the [facility]. I am faxing this note to her to have on file. Fax # [phone].

## 2016-02-04 ENCOUNTER — Other Ambulatory Visit
Admission: RE | Admit: 2016-02-04 | Discharge: 2016-02-04 | Disposition: A | Discharge status: Home / Self Care | Payer: Medicare Other | Source: Ambulatory Visit | Attending: Nurse Practitioner | Admitting: Nurse Practitioner

## 2016-02-04 DIAGNOSIS — R0602 Shortness of breath: Secondary | ICD-10-CM | POA: Insufficient documentation

## 2016-02-04 LAB — CBC WITH DIFFERENTIAL/PLATELET
BASOS ABS: 0 10*3/uL (ref 0–0.1)
Basophils Relative: 0 %
EOS PCT: 1 %
Eosinophils Absolute: 0 10*3/uL (ref 0–0.7)
HEMATOCRIT: 39.7 % (ref 35.0–47.0)
Hemoglobin: 13 g/dL (ref 12.0–16.0)
LYMPHS ABS: 2.2 10*3/uL (ref 1.0–3.6)
LYMPHS PCT: 37 %
MCH: 30.4 pg (ref 26.0–34.0)
MCHC: 32.7 g/dL (ref 32.0–36.0)
MCV: 92.8 fL (ref 80.0–100.0)
MONO ABS: 0.7 10*3/uL (ref 0.2–0.9)
Monocytes Relative: 12 %
NEUTROS ABS: 2.9 10*3/uL (ref 1.4–6.5)
Neutrophils Relative %: 50 %
PLATELETS: 169 10*3/uL (ref 150–440)
RBC: 4.27 MIL/uL (ref 3.80–5.20)
RDW: 16.9 % — AB (ref 11.5–14.5)
WBC: 5.9 10*3/uL (ref 3.6–11.0)

## 2016-02-04 LAB — BASIC METABOLIC PANEL
ANION GAP: 11 (ref 5–15)
BUN: 22 mg/dL — AB (ref 6–20)
CHLORIDE: 110 mmol/L (ref 101–111)
CO2: 23 mmol/L (ref 22–32)
Calcium: 9.2 mg/dL (ref 8.9–10.3)
Creatinine, Ser: 1.07 mg/dL — ABNORMAL HIGH (ref 0.44–1.00)
GFR calc Af Amer: 58 mL/min — ABNORMAL LOW (ref >60)
GFR, EST NON AFRICAN AMERICAN: 50 mL/min — AB (ref >60)
GLUCOSE: 97 mg/dL (ref 65–99)
POTASSIUM: 3.8 mmol/L (ref 3.5–5.1)
Sodium: 144 mmol/L (ref 135–145)

## 2020-06-09 DEATH — deceased
